# Patient Record
Sex: Male | Born: 1960
Health system: Southern US, Community
[De-identification: ages and names within clinical notes are randomized; demographics above are authoritative.]

## PROBLEM LIST (undated history)

## (undated) DIAGNOSIS — C801 Malignant (primary) neoplasm, unspecified: Secondary | ICD-10-CM

## (undated) DIAGNOSIS — E7800 Pure hypercholesterolemia, unspecified: Secondary | ICD-10-CM

## (undated) DIAGNOSIS — E119 Type 2 diabetes mellitus without complications: Secondary | ICD-10-CM

## (undated) DIAGNOSIS — E785 Hyperlipidemia, unspecified: Secondary | ICD-10-CM

## (undated) HISTORY — PX: WISDOM TOOTH EXTRACTION: SHX21

## (undated) HISTORY — DX: Malignant (primary) neoplasm, unspecified: C80.1

## (undated) HISTORY — DX: Type 2 diabetes mellitus without complications: E11.9

## (undated) HISTORY — DX: Hyperlipidemia, unspecified: E78.5

---

## 1989-08-23 HISTORY — PX: INGUINAL HERNIA REPAIR: SUR1180

## 2008-12-18 ENCOUNTER — Ambulatory Visit: Payer: Self-pay | Admitting: Family Medicine

## 2008-12-18 DIAGNOSIS — R631 Polydipsia: Secondary | ICD-10-CM | POA: Insufficient documentation

## 2008-12-18 DIAGNOSIS — L570 Actinic keratosis: Secondary | ICD-10-CM | POA: Insufficient documentation

## 2008-12-18 DIAGNOSIS — R030 Elevated blood-pressure reading, without diagnosis of hypertension: Secondary | ICD-10-CM | POA: Insufficient documentation

## 2008-12-18 DIAGNOSIS — E1165 Type 2 diabetes mellitus with hyperglycemia: Secondary | ICD-10-CM

## 2008-12-18 DIAGNOSIS — IMO0001 Reserved for inherently not codable concepts without codable children: Secondary | ICD-10-CM | POA: Insufficient documentation

## 2008-12-19 LAB — CONVERTED CEMR LAB
BUN: 16 mg/dL (ref 6–23)
CO2: 29 meq/L (ref 19–32)
Calcium: 9.2 mg/dL (ref 8.4–10.5)
Chloride: 103 meq/L (ref 96–112)
Cholesterol: 221 mg/dL — ABNORMAL HIGH (ref 0–200)
Creatinine, Ser: 0.7 mg/dL (ref 0.4–1.5)
Direct LDL: 117 mg/dL
GFR calc non Af Amer: 127.07 mL/min (ref >60)
Glucose, Bld: 253 mg/dL — ABNORMAL HIGH (ref 70–99)
HDL: 36.2 mg/dL — ABNORMAL LOW (ref >39.00)
Hgb A1c MFr Bld: 10.1 % — ABNORMAL HIGH (ref 4.6–6.5)
Potassium: 4.5 meq/L (ref 3.5–5.1)
Sodium: 139 meq/L (ref 135–145)
Total CHOL/HDL Ratio: 6
Triglycerides: 361 mg/dL — ABNORMAL HIGH (ref 0.0–149.0)
VLDL: 72.2 mg/dL — ABNORMAL HIGH (ref 0.0–40.0)

## 2009-01-16 ENCOUNTER — Ambulatory Visit: Payer: Self-pay | Admitting: Family Medicine

## 2009-01-16 DIAGNOSIS — E119 Type 2 diabetes mellitus without complications: Secondary | ICD-10-CM | POA: Insufficient documentation

## 2009-01-17 LAB — CONVERTED CEMR LAB
Creatinine,U: 95.9 mg/dL
Microalb Creat Ratio: 3.1 mg/g (ref 0.0–30.0)
Microalb, Ur: 0.3 mg/dL (ref 0.0–1.9)

## 2009-04-18 ENCOUNTER — Ambulatory Visit: Payer: Self-pay | Admitting: Family Medicine

## 2009-04-18 DIAGNOSIS — E785 Hyperlipidemia, unspecified: Secondary | ICD-10-CM | POA: Insufficient documentation

## 2009-04-21 LAB — CONVERTED CEMR LAB
Cholesterol: 180 mg/dL (ref 0–200)
HDL: 35.4 mg/dL — ABNORMAL LOW (ref >39.00)
Hgb A1c MFr Bld: 7 % — ABNORMAL HIGH (ref 4.6–6.5)
LDL Cholesterol: 110 mg/dL — ABNORMAL HIGH (ref 0–99)
Total CHOL/HDL Ratio: 5
Triglycerides: 173 mg/dL — ABNORMAL HIGH (ref 0.0–149.0)
VLDL: 34.6 mg/dL (ref 0.0–40.0)

## 2009-08-27 ENCOUNTER — Ambulatory Visit: Payer: Self-pay | Admitting: Family Medicine

## 2009-12-26 ENCOUNTER — Ambulatory Visit: Payer: Self-pay | Admitting: Family Medicine

## 2009-12-26 LAB — CONVERTED CEMR LAB
Blood Glucose, Fingerstick: 164
Cholesterol, target level: 200 mg/dL
Creatinine,U: 144.2 mg/dL
HDL goal, serum: 40 mg/dL
Hgb A1c MFr Bld: 7.4 % — ABNORMAL HIGH (ref 4.6–6.5)
LDL Goal: 100 mg/dL
Microalb Creat Ratio: 0.4 mg/g (ref 0.0–30.0)
Microalb, Ur: 0.6 mg/dL (ref 0.0–1.9)

## 2009-12-26 LAB — HM DIABETES FOOT EXAM

## 2009-12-31 ENCOUNTER — Telehealth: Payer: Self-pay | Admitting: Family Medicine

## 2010-09-22 NOTE — Progress Notes (Signed)
Summary: testing strips wrong, gave new Accu-check monitor  Phone Note Call from Patient Call back at Work Phone [phone]   Summary of Call: Strips were wrong that you ordered last for him. He gave you specific info, he says.  He didn't realize the strips were wrong & he opened the package.  [facility] will not take them back.  What can you do for him?  Needs different strips.   Wants Harriett Sine to call him.  Meanwhile he will get specific name of strips & meter.     Initial call taken by: Rudy Jew, RN,  Dec 31, 2009 1:59 PM  Follow-up for Phone Call        Pt was offered a new Accu-check Aviva monitor, he will pick-up at office today Follow-up by: Sid Falcon LPN,  Jan 01, 2010 11:39 AM

## 2010-09-22 NOTE — Assessment & Plan Note (Signed)
Summary: 4 MNTH ROV//SLM   Vital Signs:  Patient profile:   50 year old male Weight:      231 pounds Temp:     98.5 degrees F oral BP sitting:   130 / 82  (left arm) Cuff size:   large  Vitals Entered By: Sid Falcon LPN (August 27, 2009 8:26 AM) CC: 4 month follow-up   History of Present Illness: F/U diabetes and elevated blood presure. CBGs tend to run higher early AM and well controlled later day. Low 100s late day and freq around 200 early AM. Physical job with lots of walking. Last A1c 7.0%. Taking simvastatin regularly. No side effects. No regular exercise. Gained a few pounds over the holiday and recent poor dietary compliance. No neuropathy symptoms.  Allergies (verified): No Known Drug Allergies  Past History:  Past Medical History: Diabetes mellitus, type II Hyperlipidemia  Review of Systems      See HPI  Physical Exam  General:  Well-developed,well-nourished,in no acute distress; alert,appropriate and cooperative throughout examination Mouth:  Oral mucosa and oropharynx without lesions or exudates.  Teeth in good repair. Neck:  No deformities, masses, or tenderness noted. Lungs:  Normal respiratory effort, chest expands symmetrically. Lungs are clear to auscultation, no crackles or wheezes. Heart:  Normal rate and regular rhythm. S1 and S2 normal without gallop, murmur, click, rub or other extra sounds. Extremities:  No clubbing, cyanosis, edema, or deformity noted with normal full range of motion of all joints.     Impression & Recommendations:  Problem # 1:  DIABETES MELLITUS, TYPE II (ICD-250.00) Take 2 metformin at night and one in AM and work on weight loss.  Repeat A1C at f/u.  Nutritionsist referral for diabetic teaching. His updated medication list for this problem includes:    Metformin Hcl 500 Mg Tabs (Metformin hcl) .Marland Kitchen..Marland Kitchen Two by mouth q am and 1 by mouth q pm.  Orders: Nutrition Referral (Nutrition)  Problem # 2:  ELEVATED BLOOD PRESSURE  (ICD-796.2) Cont weight loss efforts.  Complete Medication List: 1)  Metformin Hcl 500 Mg Tabs (Metformin hcl) .... Two by mouth q am and 1 by mouth q pm. 2)  Simvastatin 20 Mg Tabs (Simvastatin) .Marland Kitchen.. 1 by mouth once daily.  Patient Instructions: 1)  Consider taking 2 metformin at night and one in the morning 2)  Please schedule a follow-up appointment in 4 months .  3)  It is important that you exercise reguarly at least 20 minutes 5 times a week. If you develop chest pain, have severe difficulty breathing, or feel very tired, stop exercising immediately and seek medical attention.  4)  You need to lose weight. Consider a lower calorie diet and regular exercise.

## 2010-09-22 NOTE — Assessment & Plan Note (Signed)
Summary: to be est/pt will come in fasting/njr [facility] WITH PATIENT/MHF   Vital Signs:  Patient profile:   50 year old male Height:      72 inches Weight:      237 pounds BMI:     32.26 Temp:     98 degrees F oral BP sitting:   164 / 110  (left arm) Cuff size:   regular  Vitals Entered By: Sid Falcon LPN (December 18, 2008 9:07 AM)  Serial Vital Signs/Assessments:  Time      Position  BP       Pulse  Resp  Temp     By                     148/88                         Evelena Peat MD  CC: New pt to establish, pt fasting, CBG 246 Is Patient Diabetic? No Pain Assessment Patient in pain? no        History of Present Illness: Patient is a 50 year old gentleman seen as a new patient to establish care. He is seen with almost one year history of some urine frequency, increased thirst and weight loss of approximately 18 pounds. Had good appetite. No significant visual difficulties. He has very strong family history of type 2 diabetes in both parents. He suspected elevated blood sugars and checked blood sugar with his mother's home monitor several months ago and this was around 200. He has not checked any since then. He has tried to make some dietary changes. He is reduced soda intake. He has no other chronic medical problems. Takes no medications. Nonsmoker.  Patient also states she's noticed slight scaliness on the dorsal aspect of both ears. No ulceration or bleeding. Family history significant for brother with melanoma x2. Patient generally tries to use sun protection.  Preventive Screening-Counseling & Management     Smoking Status: never  Allergies (verified): No Known Drug Allergies  Past History:  Past Medical History:    no chronic problems  Family History:    melanoma   brother    Diabetes, parent and grandparent    Family History Hypertension    Lung Cancer, father    Ovary/Uterus cancer  Social History:    Occupation:  Games developer    Married    Never  Smoked    Alcohol use-no    Occupation:  employed    Smoking Status:  never  Review of Systems       The patient complains of weight loss.  The patient denies anorexia, fever, vision loss, chest pain, dyspnea on exertion, peripheral edema, and headaches.    Physical Exam  General:  Well-developed,well-nourished,in no acute distress; alert,appropriate and cooperative throughout examination Eyes:  No corneal or conjunctival inflammation noted. EOMI. Perrla. Funduscopic exam benign, without hemorrhages, exudates or papilledema. Vision grossly normal. Ears:  patient has slight scaliness on the dorsal aspect of the right and left ears. No ulceration. Mouth:  Oral mucosa and oropharynx without lesions or exudates.  Teeth in good repair. Neck:  No deformities, masses, or tenderness noted. Lungs:  Normal respiratory effort, chest expands symmetrically. Lungs are clear to auscultation, no crackles or wheezes. Heart:  Normal rate and regular rhythm. S1 and S2 normal without gallop, murmur, click, rub or other extra sounds. Extremities:  no edema noted Skin:  no concerning pigmented lesions.   Impression &  Recommendations:  Problem # 1:  DIAB W/O MENTION COMP TYPE II/UNS TYPE UNCNTRL (ICD-250.02) This is a new diagnosis. Patient has classic findings of urine frequency, thirst, and mild weight loss. Fasting blood sugar today 246. Extensive educational materials given. Start metformin 500 mg b.i.d. At length about importance of starting regular exercise and reduction of high glycemic foods. Reassess in one month. Patient has home monitor and is encouraged to check fasting sugars at least 3 times weekly. Orders: TLB-Lipid Panel (80061-LIPID) TLB-BMP (Basic Metabolic Panel-BMET) (80048-METABOL) TLB-A1C / Hgb A1C (Glycohemoglobin) (83036-A1C)  His updated medication list for this problem includes:    Metformin Hcl 500 Mg Tabs (Metformin hcl) ..... One by mouth two times a day  Problem # 2:  ACTINIC  KERATOSIS (ICD-702.0) Suspect actinic keratoses of both ears. He has significant risk factors with skin type. Consider treatment liquid nitrogen followup visit one month. Continued adequate sun protection was wearing a hat outdoors and sunblock.  Problem # 3:  ELEVATED BLOOD PRESSURE (ICD-796.2) No diagnosis of hypertension. Follow low-sodium diet, exercise, weight loss and reassess at followup in one month. If over 130/80 at that point consider further treatment.  Check urine microalb/cr ration at f/u and if elevated consider ACE.  Complete Medication List: 1)  Metformin Hcl 500 Mg Tabs (Metformin hcl) .... One by mouth two times a day  Other Orders: Glucose, (CBG) ([address])  Patient Instructions: 1)  Please schedule a follow-up appointment in 1 month.  2)  Check your blood sugars regularly. If your readings are usually above:  or below 70 you should contact our office.  3)  See your eye doctor yearly to check for diabetic eye damage. 4)  Check your feet each night  for sore areas, calluses or signs of infection.  Prescriptions: METFORMIN HCL 500 MG TABS (METFORMIN HCL) one by mouth two times a day  #60 x 11   Entered and Authorized by:   Evelena Peat MD   Signed by:   Evelena Peat MD on 12/18/2008   Method used:   Electronically to        [facility]  [address] (retail)       [address]       [place], [place]  [address]       Ph: [phone]       Fax: [phone]   RxID:   [id number]   Preventive Care Screening  Last Tetanus Booster:    Date:  08/23/1994    Results:  Historical

## 2010-09-22 NOTE — Assessment & Plan Note (Signed)
Summary: 3 month roa//pt will be fasting//LH   Vital Signs:  Patient profile:   50 year old male Weight:      227 pounds Temp:     97.9 degrees F BP sitting:   150 / 100  (left arm) Cuff size:   regular  Vitals Entered By: Sid Falcon LPN (April 18, 2009 8:53 AM)  Serial Vital Signs/Assessments:  Time      Position  BP       Pulse  Resp  Temp     By                     138/78                         Evelena Peat MD  CC: 3 month F/U on diabetes, BS "all over the place"   History of Present Illness: Patient here for followup type 2 diabetes and dyslipidemia. Blood sugars have improved with several fastings are low 100 range. He is on metformin 500 mg 2 in the morning and one at night. Recent urine microalbumin screen negative. A1c at diagnosis 10.1%. Denies any thirst or urine frequency. He has also  lost some weight due to his efforts. Still has some poor dietary compliance at times. Elevated triglycerides and diagnosis with LDL of 117. He is due for repeat lipids and A1c at this time.  Allergies (verified): No Known Drug Allergies  Past History:  Past Medical History: Last updated: 01/16/2009 no chronic problems Diabetes mellitus, type II  Review of Systems       patient has some cold symptoms with upper respiratory congestion but no fever.  Physical Exam  General:  Well-developed,well-nourished,in no acute distress; alert,appropriate and cooperative throughout examination Eyes:  No corneal or conjunctival inflammation noted. EOMI. Perrla. Funduscopic exam benign, without hemorrhages, exudates or papilledema. Vision grossly normal. Ears:  External ear exam shows no significant lesions or deformities.  Otoscopic examination reveals clear canals, tympanic membranes are intact bilaterally without bulging, retraction, inflammation or discharge. Hearing is grossly normal bilaterally. Mouth:  Oral mucosa and oropharynx without lesions or exudates.  Teeth in good  repair. Neck:  No deformities, masses, or tenderness noted. Lungs:  Normal respiratory effort, chest expands symmetrically. Lungs are clear to auscultation, no crackles or wheezes. Heart:  Normal rate and regular rhythm. S1 and S2 normal without gallop, murmur, click, rub or other extra sounds. Extremities:  No clubbing, cyanosis, edema, or deformity noted with normal full range of motion of all joints.     Impression & Recommendations:  Problem # 1:  DIABETES MELLITUS, TYPE II (ICD-250.00)  improved by home readings. Reassess A1c today. Monitor for yearly eye exam. His updated medication list for this problem includes:    Metformin Hcl 500 Mg Tabs (Metformin hcl) .Marland Kitchen..Marland Kitchen Two by mouth q am and 1 by mouth q pm.  Orders: TLB-A1C / Hgb A1C (Glycohemoglobin) (83036-A1C)  Problem # 2:  DYSLIPIDEMIA (ICD-272.4)  goal LDL less than 100. Reassess lipids today.  Orders: TLB-Lipid Panel (80061-LIPID)  Problem # 3:  ELEVATED BLOOD PRESSURE (ICD-796.2) goal blood pressure less than 130/80. He would like to consider weight loss continued close monitoring and add ACE inh if not to goal in 3-4 months.  Complete Medication List: 1)  Metformin Hcl 500 Mg Tabs (Metformin hcl) .... Two by mouth q am and 1 by mouth q pm.  Patient Instructions: 1)  Please schedule a follow-up appointment  in 4 months .

## 2010-09-22 NOTE — Assessment & Plan Note (Signed)
Summary: 4 MONTH ROA//LH   Vital Signs:  Patient profile:   50 year old male Weight:      228 pounds BMI:     31.03 Temp:     97.8 degrees F oral BP sitting:   132 / 78  (left arm) Cuff size:   regular  Vitals Entered By: Sid Falcon LPN (Dec 26, 1608 8:28 AM)  Nutrition Counseling: Patient's BMI is greater than 25 and therefore counseled on weight management options. CC: 4 month follow-up, med refills, , Lipid Management CBG Result 164   History of Present Illness: Followup type 2 diabetes and dyslipidemia.  Patient has been out of simvastatin for one month. No prior side effects.  Blood sugars generally around low 100s later in the day around 200 fasting. Test strips out of date. No hypoglycemic symptoms. No symptoms of hyperglycemia. No eye exam yet. Poor compliance with diet and exercise. Remains on metformin.  Diabetes Management History:      He has not been enrolled in the "Diabetic Education Program".  He states understanding of dietary principles but he is not following the appropriate diet.  No sensory loss is reported.  Self foot exams are being performed.  He is checking home blood sugars.  He says that he is not exercising regularly.        Hypoglycemic symptoms are not occurring.  No hyperglycemic symptoms are reported.    Lipid Management History:      Positive NCEP/ATP III risk factors include male age 63 years old or older, diabetes, and HDL cholesterol less than 40.  Negative NCEP/ATP III risk factors include no family history for ischemic heart disease, non-tobacco-user status, non-hypertensive, no ASHD (atherosclerotic heart disease), no prior stroke/TIA, no peripheral vascular disease, and no history of aortic aneurysm.     Preventive Screening-Counseling & Management  Caffeine-Diet-Exercise     Does Patient Exercise: no  Allergies (verified): No Known Drug Allergies  Past History:  Past Medical History: Last updated: 08/27/2009 Diabetes mellitus,  type II Hyperlipidemia  Family History: Last updated: 12/18/2008 melanoma   brother Diabetes, parent and grandparent Family History Hypertension Lung Cancer, father Ovary/Uterus cancer  Social History: Last updated: 12/18/2008 Occupation:  Games developer Married Never Smoked Alcohol use-no  Risk Factors: Smoking Status: never (12/18/2008)  Social History: Does Patient Exercise:  no  Review of Systems  The patient denies chest pain, syncope, dyspnea on exertion, peripheral edema, prolonged cough, headaches, hemoptysis, and abdominal pain.    Physical Exam  General:  Well-developed,well-nourished,in no acute distress; alert,appropriate and cooperative throughout examination Mouth:  Oral mucosa and oropharynx without lesions or exudates.  Teeth in good repair. Neck:  No deformities, masses, or tenderness noted. Lungs:  Normal respiratory effort, chest expands symmetrically. Lungs are clear to auscultation, no crackles or wheezes. Heart:  Normal rate and regular rhythm. S1 and S2 normal without gallop, murmur, click, rub or other extra sounds. Extremities:  No clubbing, cyanosis, edema, or deformity noted with normal full range of motion of all joints.    Diabetes Management Exam:    Foot Exam (with socks and/or shoes not present):       Sensory-Pinprick/Light touch:          Left medial foot (L-4): normal          Left dorsal foot (L-5): normal          Left lateral foot (S-1): normal          Right medial foot (L-4): normal  Right dorsal foot (L-5): normal          Right lateral foot (S-1): normal       Sensory-Monofilament:          Left foot: normal          Right foot: normal       Inspection:          Left foot: normal          Right foot: normal       Nails:          Left foot: normal          Right foot: normal   Impression & Recommendations:  Problem # 1:  DYSLIPIDEMIA (ICD-272.4) get back on Simvastatin. His updated medication list for  this problem includes:    Simvastatin 20 Mg Tabs (Simvastatin) .Marland Kitchen... 1 by mouth once daily.  Problem # 2:  DIABETES MELLITUS, TYPE II (ICD-250.00) reassess A1C.  Diet discussed.  Start some regular exercise.  Needs eye exam. His updated medication list for this problem includes:    Metformin Hcl 500 Mg Tabs (Metformin hcl) .Marland Kitchen..Marland Kitchen Two by mouth every am and pm  Orders: Capillary Blood Glucose/CBG (11914) Venipuncture ([address]) TLB-A1C / Hgb A1C (Glycohemoglobin) (83036-A1C) TLB-Microalbumin/Creat Ratio, Urine (82043-MALB)  Problem # 3:  ELEVATED BLOOD PRESSURE (ICD-796.2) Discussed med option and he prefers to work on weight loss and monitor.  Complete Medication List: 1)  Metformin Hcl 500 Mg Tabs (Metformin hcl) .... Two by mouth every am and pm 2)  Simvastatin 20 Mg Tabs (Simvastatin) .Marland Kitchen.. 1 by mouth once daily. 3)  Accu-chek Aviva Strp (Glucose blood) .... Once daily as directed  Diabetes Management Assessment/Plan:      The following lipid goals have been established for the patient: Total cholesterol goal of 200; LDL cholesterol goal of 100; HDL cholesterol goal of 40; Triglyceride goal of 150.    Lipid Assessment/Plan:      Based on NCEP/ATP III, the patient's risk factor category is "history of diabetes".  The patient's lipid goals are as follows: Total cholesterol goal is 200; LDL cholesterol goal is 100; HDL cholesterol goal is 40; Triglyceride goal is 150.    Patient Instructions: 1)  Check your blood sugars regularly. If your readings are usually above:  or below 70 you should contact our office.  2)  It is important that your diabetic A1c level is checked every 3 months.  3)  See your eye doctor yearly to check for diabetic eye damage. 4)  Check your feet each night  for sore areas, calluses or signs of infection.  5)  Check your  Blood Pressure regularly . If it is above: 140/90  you should make an appointment. 6)  Please schedule a follow-up appointment in 4 months .    Prescriptions: SIMVASTATIN 20 MG TABS (SIMVASTATIN) 1 by mouth once daily.  #30 Each x 11   Entered and Authorized by:   Evelena Peat MD   Signed by:   Evelena Peat MD on 12/26/2009   Method used:   Electronically to        [facility]  [address] (retail)       [address]       [place], [place]  [address]       Ph: [phone]       Fax: [phone]   RxID:   [id number] METFORMIN HCL 500 MG TABS (METFORMIN HCL) two  by mouth q am and 1 by mouth q pm.  #90 x 11   Entered and Authorized by:   Evelena Peat MD   Signed by:   Evelena Peat MD on 12/26/2009   Method used:   Electronically to        [facility]  [address] [address] (retail)       [address]       [place], [place]  [address]       Ph: [phone]       Fax: [phone]   RxID:   [id number] ACCU-CHEK AVIVA  STRP (GLUCOSE BLOOD) once daily as directed  #50 x 6   Entered by:   Sid Falcon LPN   Authorized by:   Evelena Peat MD   Signed by:   Sid Falcon LPN on 84/13/2440   Method used:   Electronically to        [facility]  [address] (retail)       [address]       [place], [place]  [address]       Ph: [phone]       Fax: [phone]   RxID:   [id number]

## 2010-09-22 NOTE — Assessment & Plan Note (Signed)
Summary: 1 MNTH ROV//SLM   Vital Signs:  Patient profile:   50 year old male Height:      72 inches Weight:      234 pounds BMI:     31.85 Temp:     97.8 degrees F oral BP sitting:   130 / 90  (left arm) Cuff size:   regular  Vitals Entered By: Sid Falcon LPN (Jan 16, 2009 8:26 AM)  Nutrition Counseling: Patient's BMI is greater than 25 and therefore counseled on weight management options. CC: one month follow-up   History of Present Illness: Patient here for followup of recently diagnosed type 2 diabetes as well as elevated blood pressure. He has a home glucose monitor but has not yet acquired test strips. He has made lifestyle changes and has lost some weight by his home scales. He is having significant improvement in symptoms such as urine frequency and thirst. He is taking metformin 500 mg b.i.d. and other than occasional loose stools no side effects. He had recent mild hyperlipidemia but we explained that this may have been exacerbated by his poor diabetes control.  Allergies: No Known Drug Allergies  Past History:     Past Medical History: no chronic problems Diabetes mellitus, type II  Family History: Reviewed history from 12/18/2008 and no changes required. melanoma   brother Diabetes, parent and grandparent Family History Hypertension Lung Cancer, father Ovary/Uterus cancer  Social History: Reviewed history from 12/18/2008 and no changes required. 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  The patient denies anorexia, chest pain, syncope, dyspnea on exertion, peripheral edema, and headaches.    Physical Exam  General:  Well-developed,well-nourished,in no acute distress; alert,appropriate and cooperative throughout examination Neck:  No deformities, masses, or tenderness noted. Lungs:  Normal respiratory effort, chest expands symmetrically. Lungs are clear to  auscultation, no crackles or wheezes. Heart:  Normal rate and regular rhythm. S1 and S2 normal without gallop, murmur, click, rub or other extra sounds. Abdomen:  small umbilical hernia which is soft and nontender no other masses noted Extremities:  No clubbing, cyanosis, edema, or deformity noted with normal full range of motion of all joints.     Impression & Recommendations:  Problem # 1:  DIABETES MELLITUS, TYPE II (ICD-250.00)  check urine microalbumin creatinine ratio. Patient encouraged to get glucose test strips and monitor more frequent. Continue weight loss efforts. Continue metformin and titrate. Reassess A1c in approximately 3 months. Needs baseline eye exam. His updated medication list for this problem includes:    Metformin Hcl 500 Mg Tabs (Metformin hcl) .Marland Kitchen..Marland Kitchen Two by mouth q am and 1 by mouth q pm.  Orders: TLB-Microalbumin/Creat Ratio, Urine (82043-MALB)  Problem # 2:  ELEVATED BLOOD PRESSURE (ICD-796.2) Improved. Continue to work on weight loss efforts. Blood pressure goal is 130/80.  Complete Medication List: 1)  Metformin Hcl 500 Mg Tabs (Metformin hcl) .... Two by mouth q am and 1 by mouth q pm.  Patient Instructions: 1)  Please schedule a follow-up appointment in 3 months .  2)  Increase metformin to 2 tablets in the morning and one at night Prescriptions: METFORMIN HCL 500 MG TABS (METFORMIN HCL) two by mouth q am and 1 by mouth q pm.  #90 x 11   Entered and Authorized by:   Evelena Peat MD   Signed by:   Evelena Peat MD on 01/16/2009   Method  used:   Electronically to        [facility] (retail)       [address]       [place], [place]  [address]       Ph: [phone]       Fax: [phone]   RxID:   [id number]

## 2010-11-19 ENCOUNTER — Other Ambulatory Visit: Payer: Self-pay | Admitting: Family Medicine

## 2010-11-30 ENCOUNTER — Encounter: Payer: Self-pay | Admitting: Family Medicine

## 2010-12-01 ENCOUNTER — Encounter: Payer: Self-pay | Admitting: Family Medicine

## 2010-12-01 ENCOUNTER — Ambulatory Visit (INDEPENDENT_AMBULATORY_CARE_PROVIDER_SITE_OTHER): Payer: BLUE CROSS/BLUE SHIELD | Admitting: Family Medicine

## 2010-12-01 VITALS — BP 122/80 | Temp 98.3°F | Ht 73.0 in | Wt 236.0 lb

## 2010-12-01 DIAGNOSIS — E785 Hyperlipidemia, unspecified: Secondary | ICD-10-CM

## 2010-12-01 DIAGNOSIS — IMO0001 Reserved for inherently not codable concepts without codable children: Secondary | ICD-10-CM

## 2010-12-01 LAB — MICROALBUMIN / CREATININE URINE RATIO
Creatinine,U: 107.7 mg/dL
Microalb Creat Ratio: 0.5 mg/g (ref 0.0–30.0)
Microalb, Ur: 0.5 mg/dL (ref 0.0–1.9)

## 2010-12-01 LAB — LIPID PANEL
Cholesterol: 140 mg/dL (ref 0–200)
HDL: 37 mg/dL — ABNORMAL LOW (ref >39.00)
Total CHOL/HDL Ratio: 4
Triglycerides: 384 mg/dL — ABNORMAL HIGH (ref 0.0–149.0)
VLDL: 76.8 mg/dL — ABNORMAL HIGH (ref 0.0–40.0)

## 2010-12-01 LAB — HEPATIC FUNCTION PANEL
ALT: 38 U/L (ref 0–53)
AST: 29 U/L (ref 0–37)
Albumin: 4.2 g/dL (ref 3.5–5.2)
Alkaline Phosphatase: 48 U/L (ref 39–117)
Bilirubin, Direct: 0.1 mg/dL (ref 0.0–0.3)
Total Bilirubin: 0.6 mg/dL (ref 0.3–1.2)
Total Protein: 7.2 g/dL (ref 6.0–8.3)

## 2010-12-01 LAB — HEMOGLOBIN A1C: Hgb A1c MFr Bld: 8.4 % — ABNORMAL HIGH (ref 4.6–6.5)

## 2010-12-01 MED ORDER — LANCETS FINE 28G MISC: Status: DC

## 2010-12-01 MED ORDER — METFORMIN HCL 500 MG PO TABS: 500.0000 mg | ORAL_TABLET | Freq: Every day | ORAL | Status: DC

## 2010-12-01 MED ORDER — GLUCOSE BLOOD VI STRP: ORAL_STRIP | Status: DC

## 2010-12-01 MED ORDER — SIMVASTATIN 20 MG PO TABS: 20.0000 mg | ORAL_TABLET | Freq: Every day | ORAL | Status: DC

## 2010-12-01 NOTE — Progress Notes (Signed)
  Subjective:    Patient ID: Dylan Hamilton, male    DOB: 08-23-1961, 50 y.o.   MRN: [id number]  HPI Patient seen for medical followup. Type 2 diabetes and hyperlipidemia. Takes metformin 500 mg 2 in the morning and one at night. Simvastatin 20 mg daily. Blood pressures have been borderline elevated in the past. Adequate control by home readings.  Not monitoring blood sugars recently. No symptoms of hyper or hypoglycemia. No visual symptoms. Denies any chest pains or dyspnea.   Review of Systems  Constitutional: Negative for activity change, appetite change, fatigue and unexpected weight change.  Respiratory: Negative for cough and shortness of breath.   Cardiovascular: Negative for chest pain and palpitations.  Gastrointestinal: Negative for abdominal pain.  Neurological: Negative for dizziness and syncope.       Objective:   Physical Exam  Constitutional: He is oriented to person, place, and time. He appears well-developed and well-nourished. No distress.  HENT:  Right Ear: External ear normal.  Left Ear: External ear normal.  Mouth/Throat: Oropharynx is clear and moist. No oropharyngeal exudate.  Eyes: Pupils are equal, round, and reactive to light.  Neck: Neck supple. No thyromegaly present.  Cardiovascular: Normal rate, regular rhythm and normal heart sounds.   No murmur heard. Pulmonary/Chest: Effort normal and breath sounds normal. No respiratory distress. He has no wheezes. He has no rales.  Musculoskeletal: He exhibits no edema.  Lymphadenopathy:    He has no cervical adenopathy.  Neurological: He is alert and oriented to person, place, and time. No cranial nerve deficit.  Psychiatric: He has a normal mood and affect.          Assessment & Plan:  #1 type 2 diabetes. Reassess A1c. Discussed lifestyle management. Continue yearly eye exam. Check urine microalbumin screen. #2 dyslipidemia. Recheck lipids and hepatic panel

## 2010-12-02 ENCOUNTER — Other Ambulatory Visit: Payer: Self-pay | Admitting: Family Medicine

## 2010-12-02 LAB — LDL CHOLESTEROL, DIRECT: Direct LDL: 53.7 mg/dL

## 2010-12-02 MED ORDER — GLIMEPIRIDE 2 MG PO TABS: 2.0000 mg | ORAL_TABLET | ORAL | Status: DC

## 2010-12-02 NOTE — Progress Notes (Signed)
Quick Note:  Pt informed on personally identified cell VM ______

## 2011-04-02 ENCOUNTER — Ambulatory Visit: Payer: BLUE CROSS/BLUE SHIELD | Admitting: Family Medicine

## 2011-04-02 DIAGNOSIS — Z0289 Encounter for other administrative examinations: Secondary | ICD-10-CM

## 2011-06-16 LAB — HM DIABETES EYE EXAM

## 2011-06-17 ENCOUNTER — Encounter: Payer: Self-pay | Admitting: Family Medicine

## 2011-07-12 ENCOUNTER — Other Ambulatory Visit: Payer: Self-pay | Admitting: Family Medicine

## 2011-08-04 ENCOUNTER — Encounter: Payer: Self-pay | Admitting: Family Medicine

## 2011-08-04 ENCOUNTER — Ambulatory Visit (INDEPENDENT_AMBULATORY_CARE_PROVIDER_SITE_OTHER): Payer: BLUE CROSS/BLUE SHIELD | Admitting: Family Medicine

## 2011-08-04 DIAGNOSIS — E119 Type 2 diabetes mellitus without complications: Secondary | ICD-10-CM

## 2011-08-04 DIAGNOSIS — E785 Hyperlipidemia, unspecified: Secondary | ICD-10-CM

## 2011-08-04 DIAGNOSIS — H60509 Unspecified acute noninfective otitis externa, unspecified ear: Secondary | ICD-10-CM

## 2011-08-04 DIAGNOSIS — H60549 Acute eczematoid otitis externa, unspecified ear: Secondary | ICD-10-CM

## 2011-08-04 LAB — HEPATIC FUNCTION PANEL
ALT: 38 U/L (ref 0–53)
AST: 33 U/L (ref 0–37)
Albumin: 4.3 g/dL (ref 3.5–5.2)
Alkaline Phosphatase: 47 U/L (ref 39–117)
Bilirubin, Direct: 0.1 mg/dL (ref 0.0–0.3)
Total Bilirubin: 0.6 mg/dL (ref 0.3–1.2)
Total Protein: 7.6 g/dL (ref 6.0–8.3)

## 2011-08-04 LAB — LIPID PANEL
Cholesterol: 150 mg/dL (ref 0–200)
HDL: 39.6 mg/dL (ref >39.00)
Total CHOL/HDL Ratio: 4
Triglycerides: 416 mg/dL — ABNORMAL HIGH (ref 0.0–149.0)
VLDL: 83.2 mg/dL — ABNORMAL HIGH (ref 0.0–40.0)

## 2011-08-04 LAB — HEMOGLOBIN A1C: Hgb A1c MFr Bld: 7.9 % — ABNORMAL HIGH (ref 4.6–6.5)

## 2011-08-04 LAB — GLUCOSE, POCT (MANUAL RESULT ENTRY): POC Glucose: 190

## 2011-08-04 LAB — LDL CHOLESTEROL, DIRECT: Direct LDL: 56.5 mg/dL

## 2011-08-04 MED ORDER — MOMETASONE FUROATE 0.1 % EX SOLN: Freq: Every day | CUTANEOUS | Status: DC

## 2011-08-04 MED ORDER — GLIMEPIRIDE 2 MG PO TABS: 2.0000 mg | ORAL_TABLET | Freq: Every day | ORAL | Status: DC

## 2011-08-04 MED ORDER — SIMVASTATIN 20 MG PO TABS: 20.0000 mg | ORAL_TABLET | Freq: Every day | ORAL | Status: DC

## 2011-08-04 MED ORDER — METFORMIN HCL 500 MG PO TABS: 500.0000 mg | ORAL_TABLET | Freq: Every day | ORAL | Status: DC

## 2011-08-04 NOTE — Progress Notes (Signed)
  Subjective:    Patient ID: Dylan Hamilton, male    DOB: December 08, 1960, 50 y.o.   MRN: [id number]  HPI  Medical followup. Type 2 diabetes. Poor compliance. No regular exercise. Not monitoring blood sugars. Last A1c 8.4%. No symptoms of hyperglycemia. Hyperlipidemia with elevated triglycerides. Remains on simvastatin 20 mg daily. We recently added Amaryl 2 milligrams daily. No hypoglycemia.  Patient also complains of pruritic scaly rash ear canals intermittently. Tried hydrocortisone cream without much. No drainage.  Past Medical History  Diagnosis Date  . Diabetes mellitus type II   . Hyperlipidemia    No past surgical history on file.  reports that he has never smoked. He does not have any smokeless tobacco history on file. He reports that he does not drink alcohol or use illicit drugs. family history includes Cancer in his father and other and Diabetes in his other. No Known Allergies    Review of Systems  Constitutional: Negative for fatigue.  HENT: Negative for sore throat.   Eyes: Negative for visual disturbance.  Respiratory: Negative for cough, chest tightness and shortness of breath.   Cardiovascular: Negative for chest pain, palpitations and leg swelling.  Neurological: Negative for dizziness, syncope, weakness, light-headedness and headaches.       Objective:   Physical Exam  Constitutional: He appears well-developed and well-nourished. No distress.  HENT:  Mouth/Throat: Oropharynx is clear and moist.       Minimal scale both ear canals otherwise normal  Neck: Neck supple.  Cardiovascular: Normal rate and regular rhythm.   Pulmonary/Chest: Effort normal and breath sounds normal. No respiratory distress. He has no wheezes. He has no rales.  Musculoskeletal: He exhibits no edema.  Lymphadenopathy:    He has no cervical adenopathy.          Assessment & Plan:  #1 type 2 diabetes. History of suboptimal control. Recheck A1c. Work on weight loss #2 dyslipidemia.  Recheck lipid and hepatic panel. Consider omega-3 supplement #3 eczema ear canals.  Elocon lotion once daily as needed

## 2011-08-05 ENCOUNTER — Other Ambulatory Visit: Payer: Self-pay | Admitting: Family Medicine

## 2011-08-05 MED ORDER — GLIMEPIRIDE 4 MG PO TABS: 4.0000 mg | ORAL_TABLET | Freq: Every day | ORAL | Status: DC

## 2011-08-05 NOTE — Progress Notes (Signed)
Quick Note:  Pt informed on VM, copy sent to pt home, Amaryl 4 mg sent to pharmacy ______

## 2011-11-10 ENCOUNTER — Other Ambulatory Visit: Payer: Self-pay | Admitting: Family Medicine

## 2012-07-17 ENCOUNTER — Encounter (HOSPITAL_COMMUNITY)
Admission: EM | Disposition: A | Discharge status: Home / Self Care | Payer: Self-pay | Source: Home / Self Care | Attending: Emergency Medicine

## 2012-07-17 ENCOUNTER — Emergency Department (HOSPITAL_COMMUNITY)
Admission: EM | Admit: 2012-07-17 | Discharge: 2012-07-17 | Disposition: A | Discharge status: Home / Self Care | Payer: Worker's Compensation | Attending: Emergency Medicine | Admitting: Emergency Medicine

## 2012-07-17 ENCOUNTER — Emergency Department (HOSPITAL_COMMUNITY): Payer: Worker's Compensation | Admitting: Anesthesiology

## 2012-07-17 ENCOUNTER — Inpatient Hospital Stay: Admit: 2012-07-17 | Payer: Self-pay | Admitting: General Surgery

## 2012-07-17 ENCOUNTER — Encounter (HOSPITAL_COMMUNITY): Payer: Self-pay

## 2012-07-17 ENCOUNTER — Encounter (HOSPITAL_COMMUNITY): Payer: Self-pay | Admitting: Anesthesiology

## 2012-07-17 ENCOUNTER — Emergency Department (HOSPITAL_COMMUNITY): Payer: Worker's Compensation

## 2012-07-17 DIAGNOSIS — E785 Hyperlipidemia, unspecified: Secondary | ICD-10-CM | POA: Insufficient documentation

## 2012-07-17 DIAGNOSIS — E7800 Pure hypercholesterolemia, unspecified: Secondary | ICD-10-CM | POA: Insufficient documentation

## 2012-07-17 DIAGNOSIS — S61011A Laceration without foreign body of right thumb without damage to nail, initial encounter: Secondary | ICD-10-CM

## 2012-07-17 DIAGNOSIS — W3189XA Contact with other specified machinery, initial encounter: Secondary | ICD-10-CM | POA: Insufficient documentation

## 2012-07-17 DIAGNOSIS — E119 Type 2 diabetes mellitus without complications: Secondary | ICD-10-CM | POA: Insufficient documentation

## 2012-07-17 DIAGNOSIS — S61209A Unspecified open wound of unspecified finger without damage to nail, initial encounter: Secondary | ICD-10-CM | POA: Insufficient documentation

## 2012-07-17 DIAGNOSIS — Y9289 Other specified places as the place of occurrence of the external cause: Secondary | ICD-10-CM | POA: Insufficient documentation

## 2012-07-17 DIAGNOSIS — Y990 Civilian activity done for income or pay: Secondary | ICD-10-CM | POA: Insufficient documentation

## 2012-07-17 DIAGNOSIS — S6710XA Crushing injury of unspecified finger(s), initial encounter: Secondary | ICD-10-CM | POA: Insufficient documentation

## 2012-07-17 HISTORY — DX: Pure hypercholesterolemia, unspecified: E78.00

## 2012-07-17 HISTORY — PX: I & D EXTREMITY: SHX5045

## 2012-07-17 LAB — GLUCOSE, CAPILLARY
Glucose-Capillary: 117 mg/dL — ABNORMAL HIGH (ref 70–99)
Glucose-Capillary: 108 mg/dL — ABNORMAL HIGH (ref 70–99)

## 2012-07-17 SURGERY — IRRIGATION AND DEBRIDEMENT EXTREMITY
Anesthesia: General | Site: Thumb | Laterality: Right | Wound class: Contaminated

## 2012-07-17 MED ORDER — PROPOFOL 10 MG/ML IV BOLUS
INTRAVENOUS | Status: DC | PRN
  Administered 2012-07-17: 200 mg via INTRAVENOUS

## 2012-07-17 MED ORDER — MIDAZOLAM HCL 5 MG/5ML IJ SOLN
INTRAMUSCULAR | Status: DC | PRN
  Administered 2012-07-17: 2 mg via INTRAVENOUS

## 2012-07-17 MED ORDER — MIDAZOLAM HCL 2 MG/2ML IJ SOLN: 0.5000 mg | Freq: Once | INTRAMUSCULAR | Status: DC | PRN

## 2012-07-17 MED ORDER — ONDANSETRON HCL 4 MG/2ML IJ SOLN
INTRAMUSCULAR | Status: DC | PRN
  Administered 2012-07-17: 4 mg via INTRAVENOUS

## 2012-07-17 MED ORDER — OXYCODONE HCL 5 MG/5ML PO SOLN: 5.0000 mg | Freq: Once | ORAL | Status: DC | PRN

## 2012-07-17 MED ORDER — LACTATED RINGERS IV SOLN
INTRAVENOUS | Status: DC | PRN
  Administered 2012-07-17 (×2): via INTRAVENOUS

## 2012-07-17 MED ORDER — SODIUM CHLORIDE 0.9 % IV SOLN
INTRAVENOUS | Status: DC
  Administered 2012-07-17: 16:00:00 via INTRAVENOUS

## 2012-07-17 MED ORDER — PROMETHAZINE HCL 25 MG/ML IJ SOLN: 6.2500 mg | INTRAMUSCULAR | Status: DC | PRN

## 2012-07-17 MED ORDER — EPHEDRINE SULFATE 50 MG/ML IJ SOLN
INTRAMUSCULAR | Status: DC | PRN
  Administered 2012-07-17 (×2): 5 mg via INTRAVENOUS

## 2012-07-17 MED ORDER — FENTANYL CITRATE 0.05 MG/ML IJ SOLN
INTRAMUSCULAR | Status: DC | PRN
  Administered 2012-07-17: 100 ug via INTRAVENOUS
  Administered 2012-07-17 (×3): 50 ug via INTRAVENOUS

## 2012-07-17 MED ORDER — IPRATROPIUM BROMIDE 0.02 % IN SOLN: 0.5000 mg | Freq: Once | RESPIRATORY_TRACT | Status: DC

## 2012-07-17 MED ORDER — CEFAZOLIN SODIUM 1-5 GM-% IV SOLN
1.0000 g | Freq: Once | INTRAVENOUS | Status: AC
  Administered 2012-07-17 (×2): 1 g via INTRAVENOUS
  Filled 2012-07-17: qty 50

## 2012-07-17 MED ORDER — SUCCINYLCHOLINE CHLORIDE 20 MG/ML IJ SOLN
INTRAMUSCULAR | Status: DC | PRN
  Administered 2012-07-17: 120 mg via INTRAVENOUS

## 2012-07-17 MED ORDER — ALBUTEROL SULFATE (5 MG/ML) 0.5% IN NEBU: 5.0000 mg | INHALATION_SOLUTION | Freq: Once | RESPIRATORY_TRACT | Status: DC

## 2012-07-17 MED ORDER — POLYMYXIN B SULFATE 500000 UNITS IJ SOLR
INTRAMUSCULAR | Status: DC | PRN
  Administered 2012-07-17: 21:00:00

## 2012-07-17 MED ORDER — ROCURONIUM BROMIDE 100 MG/10ML IV SOLN
INTRAVENOUS | Status: DC | PRN
  Administered 2012-07-17: 50 mg via INTRAVENOUS

## 2012-07-17 MED ORDER — HYDROMORPHONE HCL PF 1 MG/ML IJ SOLN: 0.2500 mg | INTRAMUSCULAR | Status: DC | PRN

## 2012-07-17 MED ORDER — PREDNISONE 20 MG PO TABS: 60.0000 mg | ORAL_TABLET | Freq: Once | ORAL | Status: DC

## 2012-07-17 MED ORDER — MEPERIDINE HCL 25 MG/ML IJ SOLN: 6.2500 mg | INTRAMUSCULAR | Status: DC | PRN

## 2012-07-17 MED ORDER — NEOSTIGMINE METHYLSULFATE 1 MG/ML IJ SOLN
INTRAMUSCULAR | Status: DC | PRN
  Administered 2012-07-17: 5 mg via INTRAVENOUS

## 2012-07-17 MED ORDER — GLYCOPYRROLATE 0.2 MG/ML IJ SOLN
INTRAMUSCULAR | Status: DC | PRN
  Administered 2012-07-17: .7 mg via INTRAVENOUS

## 2012-07-17 MED ORDER — BUPIVACAINE HCL (PF) 0.25 % IJ SOLN
INTRAMUSCULAR | Status: DC | PRN
  Administered 2012-07-17: 8 mL

## 2012-07-17 MED ORDER — DEXTROSE 5 % IV SOLN
INTRAVENOUS | Status: DC | PRN
  Administered 2012-07-17: 21:00:00 via INTRAVENOUS

## 2012-07-17 MED ORDER — HYDROMORPHONE HCL PF 1 MG/ML IJ SOLN
1.0000 mg | Freq: Once | INTRAMUSCULAR | Status: AC
  Administered 2012-07-17: 1 mg via INTRAVENOUS
  Filled 2012-07-17: qty 1

## 2012-07-17 MED ORDER — OXYCODONE HCL 5 MG PO TABS: 5.0000 mg | ORAL_TABLET | Freq: Once | ORAL | Status: DC | PRN

## 2012-07-17 MED ORDER — ONDANSETRON HCL 4 MG/2ML IJ SOLN
4.0000 mg | Freq: Once | INTRAMUSCULAR | Status: AC
  Administered 2012-07-17: 4 mg via INTRAVENOUS
  Filled 2012-07-17: qty 2

## 2012-07-17 MED ORDER — SODIUM CHLORIDE 0.9 % IR SOLN
Status: DC | PRN
  Administered 2012-07-17: 1

## 2012-07-17 SURGICAL SUPPLY — 45 items
BAG DECANTER FOR FLEXI CONT (MISCELLANEOUS) ×2 IMPLANT
BANDAGE CONFORM 2  STR LF (GAUZE/BANDAGES/DRESSINGS) ×2 IMPLANT
BANDAGE ELASTIC 3 VELCRO ST LF (GAUZE/BANDAGES/DRESSINGS) IMPLANT
BANDAGE ELASTIC 4 VELCRO ST LF (GAUZE/BANDAGES/DRESSINGS) IMPLANT
BANDAGE GAUZE ELAST BULKY 4 IN (GAUZE/BANDAGES/DRESSINGS) ×2 IMPLANT
BNDG COHESIVE 1X5 TAN STRL LF (GAUZE/BANDAGES/DRESSINGS) ×2 IMPLANT
BNDG ELASTIC 2 VLCR STRL LF (GAUZE/BANDAGES/DRESSINGS) IMPLANT
CLOTH BEACON ORANGE TIMEOUT ST (SAFETY) ×2 IMPLANT
CORDS BIPOLAR (ELECTRODE) ×2 IMPLANT
CUFF TOURNIQUET SINGLE 18IN (TOURNIQUET CUFF) ×2 IMPLANT
DRAPE SURG 17X23 STRL (DRAPES) ×2 IMPLANT
ELECT REM PT RETURN 9FT ADLT (ELECTROSURGICAL)
ELECTRODE REM PT RTRN 9FT ADLT (ELECTROSURGICAL) IMPLANT
GAUZE PACKING IODOFORM 1/4X5 (PACKING) IMPLANT
GAUZE XEROFORM 1X8 LF (GAUZE/BANDAGES/DRESSINGS) ×2 IMPLANT
GLOVE ORTHO TXT STRL SZ7.5 (GLOVE) ×2 IMPLANT
GOWN STRL NON-REIN LRG LVL3 (GOWN DISPOSABLE) ×4 IMPLANT
HANDPIECE INTERPULSE COAX TIP (DISPOSABLE)
KIT BASIN OR (CUSTOM PROCEDURE TRAY) ×2 IMPLANT
KIT ROOM TURNOVER OR (KITS) ×2 IMPLANT
MANIFOLD NEPTUNE II (INSTRUMENTS) ×2 IMPLANT
NEEDLE HYPO 25GX1X1/2 BEV (NEEDLE) ×2 IMPLANT
NS IRRIG 1000ML POUR BTL (IV SOLUTION) ×2 IMPLANT
PACK ORTHO EXTREMITY (CUSTOM PROCEDURE TRAY) ×2 IMPLANT
PAD ARMBOARD 7.5X6 YLW CONV (MISCELLANEOUS) ×4 IMPLANT
PAD CAST 4YDX4 CTTN HI CHSV (CAST SUPPLIES) IMPLANT
PADDING CAST COTTON 4X4 STRL (CAST SUPPLIES)
SET HNDPC FAN SPRY TIP SCT (DISPOSABLE) IMPLANT
SOAP 2 % CHG 4 OZ (WOUND CARE) ×2 IMPLANT
SPONGE GAUZE 4X4 12PLY (GAUZE/BANDAGES/DRESSINGS) ×2 IMPLANT
SPONGE LAP 18X18 X RAY DECT (DISPOSABLE) IMPLANT
SPONGE LAP 4X18 X RAY DECT (DISPOSABLE) ×2 IMPLANT
SUT CHROMIC 6 0 PS 4 (SUTURE) ×2 IMPLANT
SUT ETHILON 5 0 P 3 18 (SUTURE) ×2
SUT FIBERWIRE 4-0 18 DIAM BLUE (SUTURE) ×2
SUT NYLON ETHILON 5-0 P-3 1X18 (SUTURE) ×2 IMPLANT
SUT PROLENE 4 0 P 3 18 (SUTURE) ×4 IMPLANT
SUTURE FIBERWR 4-0 18 DIA BLUE (SUTURE) ×1 IMPLANT
SYR CONTROL 10ML LL (SYRINGE) ×2 IMPLANT
TOWEL OR 17X24 6PK STRL BLUE (TOWEL DISPOSABLE) ×2 IMPLANT
TOWEL OR 17X26 10 PK STRL BLUE (TOWEL DISPOSABLE) ×2 IMPLANT
TUBE ANAEROBIC SPECIMEN COL (MISCELLANEOUS) IMPLANT
TUBE CONNECTING 12X1/4 (SUCTIONS) ×2 IMPLANT
WATER STERILE IRR 1000ML POUR (IV SOLUTION) ×2 IMPLANT
YANKAUER SUCT BULB TIP NO VENT (SUCTIONS) ×2 IMPLANT

## 2012-07-17 NOTE — Transfer of Care (Signed)
Immediate Anesthesia Transfer of Care Note  Patient: Dylan Hamilton  Procedure(s) Performed: Procedure(s) (LRB) with comments: IRRIGATION AND DEBRIDEMENT EXTREMITY (Right) - Repair Right thumb laceration  Patient Location: PACU  Anesthesia Type:General  Level of Consciousness: awake, alert  and oriented  Airway & Oxygen Therapy: Patient Spontanous Breathing and Patient connected to nasal cannula oxygen  Post-op Assessment: Report given to PACU RN and Post -op Vital signs reviewed and stable  Post vital signs: Reviewed and stable  Complications: No apparent anesthesia complications

## 2012-07-17 NOTE — ED Notes (Addendum)
Pt from work, c/o thumb caught in belt of a washer, degloved the right thumb, bone intact, tetnus up to date

## 2012-07-17 NOTE — Anesthesia Postprocedure Evaluation (Signed)
  Anesthesia Post-op Note  Patient: Dylan Hamilton  Procedure(s) Performed: Procedure(s) (LRB) with comments: IRRIGATION AND DEBRIDEMENT EXTREMITY (Right) - Repair Right thumb laceration  Patient Location: PACU  Anesthesia Type:General  Level of Consciousness: awake, alert , oriented and patient cooperative  Airway and Oxygen Therapy: Patient Spontanous Breathing  Post-op Pain: none  Post-op Assessment: Post-op Vital signs reviewed, Patient's Cardiovascular Status Stable, Respiratory Function Stable, Patent Airway, No signs of Nausea or vomiting and Pain level controlled  Post-op Vital Signs: Reviewed and stable  Complications: No apparent anesthesia complications

## 2012-07-17 NOTE — H&P (Signed)
Reason for Consult:laceration R thumb Referring Physician: ER  Ahijah Over is an 51 y.o. right handed male.  HPI: pt was using a commercial washer at work, got thumb caught / c/o pain 9/10, sharp, bleeding, constant, non radiating, c/o skin loose.  Past Medical History  Diagnosis Date  . Diabetes mellitus type II   . Hyperlipidemia   . Hypercholesterolemia     History reviewed. No pertinent past surgical history.  Family History  Problem Relation Age of Onset  . Cancer Father     lung  . Cancer Other   . Diabetes Other     Social History:  reports that he has never smoked. He does not have any smokeless tobacco history on file. He reports that he does not drink alcohol or use illicit drugs.  Allergies: No Known Allergies  Medications: I have reviewed the patient's current medications.  No results found for this or any previous visit (from the past 48 hour(s)).  Dg Finger Thumb Right  07/17/2012  *RADIOLOGY REPORT*  Clinical Data: Right thumb injury.  RIGHT THUMB 2+V  Comparison: None.  Findings: Three views of the thumb demonstrate soft tissue swelling along the tip.  No evidence for acute fracture or dislocation. There are areas of spurring involving the right thumb.  IMPRESSION: Soft tissue swelling without acute fracture.  Mild degenerative changes.   Original Report Authenticated By: Richarda Overlie, M.D.     Pertinent items are noted in HPI. Temp:  [97.9 F (36.6 C)] 97.9 F (36.6 C) (11/25 1439) Pulse Rate:  [79] 79  (11/25 1439) Resp:  [16] 16  (11/25 1439) BP: (148)/(98) 148/98 mmHg (11/25 1439) SpO2:  [93 %] 93 % (11/25 1439) General appearance: alert and cooperative Resp: clear to auscultation bilaterally Cardio: regular rate and rhythm Extremities: extremities normal, atraumatic, no cyanosis or edema except for R thumb with degloving type injury involving volar tissue, nail bed laceration as well, flap is dirty but appears viable   Assessment/Plan: Complex  degloving type laceration to R thumb Plan: To or for exploration repair  Jissel Slavens CHRISTOPHER 07/17/2012, 5:53 PM

## 2012-07-17 NOTE — Preoperative (Signed)
Beta Blockers   Not taking beta Blockers

## 2012-07-17 NOTE — Anesthesia Procedure Notes (Signed)
Procedure Name: Intubation Date/Time: 07/17/2012 8:50 PM Performed by: Markala Sitts S Pre-anesthesia Checklist: Patient identified, Timeout performed, Emergency Drugs available, Suction available and Patient being monitored Patient Re-evaluated:Patient Re-evaluated prior to inductionOxygen Delivery Method: Circle system utilized Preoxygenation: Pre-oxygenation with 100% oxygen Intubation Type: IV induction Ventilation: Mask ventilation without difficulty Laryngoscope Size: Mac and 4 Grade View: Grade I Tube type: Oral Tube size: 8.0 mm Number of attempts: 2 Airway Equipment and Method: Bougie stylet Placement Confirmation: ETT inserted through vocal cords under direct vision,  positive ETCO2 and breath sounds checked- equal and bilateral Secured at: 23 cm Tube secured with: Tape Dental Injury: Teeth and Oropharynx as per pre-operative assessment

## 2012-07-17 NOTE — Anesthesia Preprocedure Evaluation (Signed)
Anesthesia Evaluation  Patient identified by MRN, date of birth, ID band Patient awake    Reviewed: Allergy & Precautions, H&P , NPO status , Patient's Chart, lab work & pertinent test results  History of Anesthesia Complications Negative for: history of anesthetic complications  Airway Mallampati: II TM Distance: >3 FB Neck ROM: Full    Dental  (+) Partial Lower, Caps and Dental Advisory Given   Pulmonary neg pulmonary ROS,  breath sounds clear to auscultation  Pulmonary exam normal       Cardiovascular negative cardio ROS  Rhythm:Regular Rate:Normal     Neuro/Psych negative neurological ROS     GI/Hepatic Neg liver ROS, GERD-  Poorly Controlled,  Endo/Other  diabetes (glu 117), Well Controlled, Type 2, Oral Hypoglycemic AgentsMorbid obesity  Renal/GU negative Renal ROS     Musculoskeletal   Abdominal (+) + obese,   Peds  Hematology   Anesthesia Other Findings   Reproductive/Obstetrics                           Anesthesia Physical Anesthesia Plan  ASA: II  Anesthesia Plan: General   Post-op Pain Management:    Induction: Intravenous  Airway Management Planned: Oral ETT  Additional Equipment:   Intra-op Plan:   Post-operative Plan: Extubation in OR  Informed Consent: I have reviewed the patients History and Physical, chart, labs and discussed the procedure including the risks, benefits and alternatives for the proposed anesthesia with the patient or authorized representative who has indicated his/her understanding and acceptance.   Dental advisory given  Plan Discussed with: Surgeon and CRNA  Anesthesia Plan Comments: (Plan routine monitors, GETA)        Anesthesia Quick Evaluation

## 2012-07-17 NOTE — ED Provider Notes (Signed)
History     CSN: [id number]  Arrival date & time 07/17/12  1437   First MD Initiated Contact with Patient 07/17/12 1459      Chief Complaint  Patient presents with  . Finger Injury    (Consider location/radiation/quality/duration/timing/severity/associated sxs/prior treatment) The history is provided by the patient.  pt c/o injury to right thumb at work just pta today. Got right thumb caught in belt of machine. Partial degloving injury/crush injury to thumb. Right hand dominant. Constant, dull, moderate-sev, non radiating pain, worse w palpation. Thumb feels mildly numb. Denies other injury.   Past Medical History  Diagnosis Date  . Diabetes mellitus type II   . Hyperlipidemia   . Hypercholesterolemia     History reviewed. No pertinent past surgical history.  Family History  Problem Relation Age of Onset  . Cancer Father     lung  . Cancer Other   . Diabetes Other     History  Substance Use Topics  . Smoking status: Never Smoker   . Smokeless tobacco: Not on file  . Alcohol Use: No      Review of Systems  Constitutional: Negative for fever.  Respiratory: Negative for shortness of breath.   Skin: Positive for wound.  Neurological: Positive for numbness.    Allergies  Review of patient's allergies indicates no known allergies.  Home Medications   Current Outpatient Rx  Name  Route  Sig  Dispense  Refill  . GLIMEPIRIDE 4 MG PO TABS   Oral   Take 4 mg by mouth daily before breakfast.         . METFORMIN HCL 500 MG PO TABS   Oral   Take 1,000 mg by mouth 2 (two) times daily with a meal.         . SIMVASTATIN 20 MG PO TABS   Oral   Take 1 tablet (20 mg total) by mouth at bedtime.   90 tablet   3   . GLUCOSE BLOOD VI STRP      Use as instructed   100 each   3     Use daily as instructed   . LANCETS FINE 28G MISC      Test BS daily as directed   100 each   3     BP 148/98  Pulse 79  Temp 97.9 F (36.6 C)  Resp 16  SpO2  93%  Physical Exam  Nursing note and vitals reviewed. Constitutional: He is oriented to person, place, and time. He appears well-developed and well-nourished. No distress.  HENT:  Head: Atraumatic.  Eyes: Conjunctivae normal are normal.  Neck: Neck supple. No tracheal deviation present.  Cardiovascular: Normal rate and intact distal pulses.   Pulmonary/Chest: Effort normal. No accessory muscle usage. No respiratory distress.  Abdominal: He exhibits no distension.  Musculoskeletal: Normal range of motion.       sts and tenderness to right thumb. Partial degloving injury w multiple lacs to thumb, nail avulsed. Limited rom ?pain/swelling.   Neurological: He is alert and oriented to person, place, and time.  Skin: Skin is warm and dry.  Psychiatric: He has a normal mood and affect.    ED Course  Procedures (including critical care time)  Dg Finger Thumb Right  07/17/2012  *RADIOLOGY REPORT*  Clinical Data: Right thumb injury.  RIGHT THUMB 2+V  Comparison: None.  Findings: Three views of the thumb demonstrate soft tissue swelling along the tip.  No evidence for acute fracture or dislocation. There  are areas of spurring involving the right thumb.  IMPRESSION: Soft tissue swelling without acute fracture.  Mild degenerative changes.   Original Report Authenticated By: Richarda Overlie, M.D.        MDM  Xrays.   Iv ns. Dilaudid 1 mg iv. zofran iv. Ancef iv. Tetanus up to date within past year.  Reviewed nursing notes and prior charts for additional history.   Sterile moist dressing.   Hand surgery paged after initial eval.  Hand repaged post xray.   Dr Izora Ribas states will see in ED, and he will repair thumb here.  Recheck pain relieved post meds and digit block - sterile technique, 2% lido no epi, total 6 cc used.  Recheck intact cap refill distally. Pt able to flex/extend thumb.   Definitive wound care/suturing, and follow up per Dr Izora Ribas.           Suzi Roots,  MD 07/17/12 [phone]

## 2012-07-18 ENCOUNTER — Other Ambulatory Visit: Payer: Self-pay | Admitting: Family Medicine

## 2012-07-18 ENCOUNTER — Encounter (HOSPITAL_COMMUNITY): Payer: Self-pay | Admitting: General Surgery

## 2012-07-18 NOTE — Op Note (Signed)
NAMEEULISES, KIJOWSKI NO.:  [id number]  MEDICAL RECORD NO.:  [id number]  LOCATION:  [facility]                         FACILITY:  [facility]  PHYSICIAN:  Johnette Abraham, MD    DATE OF BIRTH:  10/09/1960  DATE OF PROCEDURE:  07/17/2012 DATE OF DISCHARGE:                              OPERATIVE REPORT   PREOPERATIVE DIAGNOSIS:  Complex laceration and degloving injury to his right thumb.  POSTOPERATIVE DIAGNOSIS:  Complex laceration and degloving injury to his right thumb.  PROCEDURE:  Exploration of the wound, right thumb.  Repair of nail bed, right thumb.  Repair of extensor tendon, right thumb.  Repair of laceration, totaling 8 cm.  ANESTHESIA:  General.  INDICATIONS:  Mr. Paulos is a pleasant, who got his thumb caught in the some kind of washing apparatus, presented emergency room.  I was consulted for definitive care.  On evaluation, he had a complex laceration with active bleeding.  Risks, benefits, and alternatives of surgery were thoroughly discussed with him, and agreed to proceed with surgery.  Consent was obtained.  DESCRIPTION OF PROCEDURE:  The patient was taken to the operating room, placed supine on the operating room table.  General anesthesia was administered.  The right thumb was prepped and draped in a normal sterile fashion.  A tourniquet was used and inflated to 250 mmHg.  The wound was explored.  There was a large degloving type of laceration to the thumb, it was held in place ulnarly.  There was also a transverse laceration over the volar aspect underlying the IP joint, dorsally. There was a laceration over the IP joint, with extensor tendon laceration.  There was skin loss to the distal tip.  There was some skin loss to the ulnar nail bed area.  There was a complex nail bed laceration.  There was nonviable skin edges throughout all of these lacerations.  Next, thorough irrigation with bacitracin antibiotic irrigation fluid for approximately a  liter as well as scraping and rough debriding of all of the skin and subcutaneous tissues.  There was still quite a bit of staining into the tissues even after thorough cleansing was performed.  The small laceration on the dorsal aspect of the thumb was then lengthened to expose both the ends of the extensor tendon.  The extensor tendon was grasped and a modified capsular repair with 4-0 FiberWire, the tendon edges were approximated nicely.  The skin overlying this was closed with interrupted 5-0 nylon sutures.  The nail bed was then addressed.  There was a stellate complex laceration down to the distal phalanx.  The pieces of nail matrix were then approximated with interrupted 6-0 chromic sutures.  There was a scum at skin flap forming most of the eponychial fold, and it was also lacerated this was tacked down with interrupted 5-0 nylon.  There was some skin loss and abrasion to the ulnar aspect of the thumb that was unable to be approximated.  Next, the complex laceration on the volar aspect was addressed.  Again, there was a large gaping laceration down to the flexor tendon sheath.  The neurovascular structures were exposed.  This was held ulnarly and again  there was a horizontal component to this laceration.  The horizontal component to the laceration was closed with interrupted 4-0 Vicryl after skin and subcutaneous tissue were debrided. The tourniquet was released.  Hemostasis was achieved with the bipolar. Starting distally, the skin was intact with a large interrupted bites of 4-0 Prolene and this was worked from distal to proximal, sewing laceration loosely but nicely approximating the thumb tissue.  There remainder of the laceration was closed in this fashion.  The flap was actually pink at the conclusion of the procedure.  A small piece of Xeroform was placed up underneath the repaired eponychial fold and to protect the nail bed.  Further pieces of Xeroform were also placed  over the wound.  Sterile dressing and Ace wrap were placed.  The patient tolerated the procedure well and was taken to the recovery room in stable condition.     Johnette Abraham, MD     HCC/MEDQ  D:  07/17/2012  T:  07/18/2012  Job:  [id number]

## 2012-08-17 ENCOUNTER — Encounter: Payer: Self-pay | Admitting: Family Medicine

## 2012-08-17 ENCOUNTER — Ambulatory Visit (INDEPENDENT_AMBULATORY_CARE_PROVIDER_SITE_OTHER): Payer: BLUE CROSS/BLUE SHIELD | Admitting: Family Medicine

## 2012-08-17 VITALS — BP 130/90 | Temp 98.0°F | Wt 232.0 lb

## 2012-08-17 DIAGNOSIS — J340 Abscess, furuncle and carbuncle of nose: Secondary | ICD-10-CM

## 2012-08-17 DIAGNOSIS — J3489 Other specified disorders of nose and nasal sinuses: Secondary | ICD-10-CM

## 2012-08-17 MED ORDER — DOXYCYCLINE HYCLATE 100 MG PO TABS: 100.0000 mg | ORAL_TABLET | Freq: Two times a day (BID) | ORAL | Status: DC

## 2012-08-17 MED ORDER — MUPIROCIN 2 % EX OINT: TOPICAL_OINTMENT | Freq: Three times a day (TID) | CUTANEOUS | Status: DC

## 2012-08-17 NOTE — Progress Notes (Signed)
  Subjective:    Patient ID: Dylan Hamilton, male    DOB: 1961/03/24, 51 y.o.   MRN: [id number]  HPI  Patient seen with erythema, tenderness, and swelling tip of the nose and nostril area over the past couple days. Started 2 days ago. Possible chills last night but no fever documented. He has some mild headaches. No nausea or vomiting. Took 2 leftover Cipro yesterday and one today without improvement. No history of MRSA  History of type 2 diabetes. Not checking blood sugars past couple days but generally fairly well controlled.   Review of Systems  Constitutional: Positive for chills. Negative for fever.  Gastrointestinal: Negative for nausea and vomiting.  Psychiatric/Behavioral: Negative for confusion.       Objective:   Physical Exam  Constitutional: He appears well-developed and well-nourished.  HENT:  Right Ear: External ear normal.  Left Ear: External ear normal.  Mouth/Throat: Oropharynx is clear and moist.       Patient has some erythema and tenderness involving the distal tip of the nose. He has evidence for some erythema and slight pustular area involving right naris but no fluctuance. No vesicles. Left naris reveals no purulence.  Cardiovascular: Normal rate and regular rhythm.   Pulmonary/Chest: Effort normal and breath sounds normal. No respiratory distress. He has no wheezes. He has no rales.          Assessment & Plan:  Cellulitis nose. He has slight pustular change involving right naris but no fluctuant abscess. Start Bactroban ointment 2-3 times daily intranasal and doxycycline 100 mg twice a day to cover possible organisms such as MRSA. He is to followup immediately if he has any increased fever, vomiting, increased swelling, or increased redness. If any worsening refer to ENT though we did not see any clear evidence of abscess at this point. No visible purulent drainage.

## 2012-08-17 NOTE — Patient Instructions (Addendum)
Follow up promptly for any increasing fever, vomiting, increased swelling, or increased redness.

## 2012-08-30 ENCOUNTER — Ambulatory Visit (INDEPENDENT_AMBULATORY_CARE_PROVIDER_SITE_OTHER): Payer: BC Managed Care – PPO | Admitting: Family Medicine

## 2012-08-30 ENCOUNTER — Encounter: Payer: Self-pay | Admitting: Family Medicine

## 2012-08-30 VITALS — BP 130/80 | HR 72 | Temp 98.6°F | Resp 12 | Ht 72.5 in | Wt 237.0 lb

## 2012-08-30 DIAGNOSIS — Z0000 Encounter for general adult medical examination without abnormal findings: Secondary | ICD-10-CM | POA: Insufficient documentation

## 2012-08-30 DIAGNOSIS — E119 Type 2 diabetes mellitus without complications: Secondary | ICD-10-CM

## 2012-08-30 LAB — CBC WITH DIFFERENTIAL/PLATELET
Basophils Absolute: 0 10*3/uL (ref 0.0–0.1)
Basophils Relative: 0.4 % (ref 0.0–3.0)
Eosinophils Absolute: 0.3 10*3/uL (ref 0.0–0.7)
Eosinophils Relative: 3.4 % (ref 0.0–5.0)
HCT: 40.8 % (ref 39.0–52.0)
Hemoglobin: 13.9 g/dL (ref 13.0–17.0)
Lymphocytes Relative: 24.4 % (ref 12.0–46.0)
Lymphs Abs: 2.3 10*3/uL (ref 0.7–4.0)
MCHC: 34.1 g/dL (ref 30.0–36.0)
MCV: 90.6 fl (ref 78.0–100.0)
Monocytes Absolute: 0.9 10*3/uL (ref 0.1–1.0)
Monocytes Relative: 10 % (ref 3.0–12.0)
Neutro Abs: 5.8 10*3/uL (ref 1.4–7.7)
Neutrophils Relative %: 61.8 % (ref 43.0–77.0)
Platelets: 309 10*3/uL (ref 150.0–400.0)
RBC: 4.5 Mil/uL (ref 4.22–5.81)
RDW: 12.4 % (ref 11.5–14.6)
WBC: 9.3 10*3/uL (ref 4.5–10.5)

## 2012-08-30 LAB — BASIC METABOLIC PANEL
BUN: 14 mg/dL (ref 6–23)
CO2: 24 mEq/L (ref 19–32)
Calcium: 9.2 mg/dL (ref 8.4–10.5)
Chloride: 102 mEq/L (ref 96–112)
Creatinine, Ser: 0.8 mg/dL (ref 0.4–1.5)
GFR: 105.82 mL/min (ref >60.00)
Glucose, Bld: 186 mg/dL — ABNORMAL HIGH (ref 70–99)
Potassium: 4.5 mEq/L (ref 3.5–5.1)
Sodium: 135 mEq/L (ref 135–145)

## 2012-08-30 LAB — HEMOGLOBIN A1C: Hgb A1c MFr Bld: 8.5 % — ABNORMAL HIGH (ref 4.6–6.5)

## 2012-08-30 LAB — HEPATIC FUNCTION PANEL
ALT: 50 U/L (ref 0–53)
AST: 40 U/L — ABNORMAL HIGH (ref 0–37)
Albumin: 4.1 g/dL (ref 3.5–5.2)
Alkaline Phosphatase: 50 U/L (ref 39–117)
Bilirubin, Direct: 0.1 mg/dL (ref 0.0–0.3)
Total Bilirubin: 1 mg/dL (ref 0.3–1.2)
Total Protein: 7.3 g/dL (ref 6.0–8.3)

## 2012-08-30 LAB — LIPID PANEL
Cholesterol: 144 mg/dL (ref 0–200)
HDL: 36.2 mg/dL — ABNORMAL LOW (ref >39.00)
Total CHOL/HDL Ratio: 4
Triglycerides: 314 mg/dL — ABNORMAL HIGH (ref 0.0–149.0)
VLDL: 62.8 mg/dL — ABNORMAL HIGH (ref 0.0–40.0)

## 2012-08-30 LAB — TSH: TSH: 1.92 u[IU]/mL (ref 0.35–5.50)

## 2012-08-30 LAB — LDL CHOLESTEROL, DIRECT: Direct LDL: 65.3 mg/dL

## 2012-08-30 LAB — MICROALBUMIN / CREATININE URINE RATIO
Creatinine,U: 140.4 mg/dL
Microalb Creat Ratio: 0.7 mg/g (ref 0.0–30.0)
Microalb, Ur: 1 mg/dL (ref 0.0–1.9)

## 2012-08-30 LAB — PSA: PSA: 0.88 ng/mL (ref 0.10–4.00)

## 2012-08-30 NOTE — Progress Notes (Signed)
Subjective:    Patient ID: Dylan Hamilton, male    DOB: 08/18/1961, 52 y.o.   MRN: [id number]  HPI Patient here for complete physical. Has chronic problems include history of type 2 diabetes, hyperlipidemia. Blood sugars have not been extremely well controlled. He appears to have good late day sugars with fastings frequently around 180. Last A1c 7.9%. No history of screening colonoscopy. Recent extensive injury to right thumb. Followed closely by hand surgeon and this is healing well. Tetanus was 2013. Patient declines flu vaccine. Compliant with medications. No hypoglycemia.  Past Medical History  Diagnosis Date  . Diabetes mellitus type II   . Hyperlipidemia   . Hypercholesterolemia    Past Surgical History  Procedure Date  . I&d extremity 07/17/2012    Procedure: IRRIGATION AND DEBRIDEMENT EXTREMITY;  Surgeon: Johnette Abraham, MD;  Location: [facility] OR;  Service: Plastics;  Laterality: Right;  Repair Right thumb laceration    reports that he has never smoked. He does not have any smokeless tobacco history on file. He reports that he does not drink alcohol or use illicit drugs. family history includes Cancer in his father and other; Diabetes in his other; and Stroke in his paternal grandfather. No Known Allergies   Review of Systems  Constitutional: Negative for fever, activity change, appetite change and fatigue.  HENT: Negative for ear pain, congestion and trouble swallowing.   Eyes: Negative for pain and visual disturbance.  Respiratory: Negative for cough, shortness of breath and wheezing.   Cardiovascular: Negative for chest pain and palpitations.  Gastrointestinal: Negative for nausea, vomiting, abdominal pain, diarrhea, constipation, blood in stool, abdominal distention and rectal pain.  Genitourinary: Negative for dysuria, hematuria and testicular pain.  Musculoskeletal: Negative for joint swelling and arthralgias.  Skin: Negative for rash.  Neurological: Negative for dizziness,  syncope and headaches.  Hematological: Negative for adenopathy.  Psychiatric/Behavioral: Negative for confusion and dysphoric mood.       Objective:   Physical Exam  Constitutional: He is oriented to person, place, and time. He appears well-developed and well-nourished. No distress.  HENT:  Head: Normocephalic and atraumatic.  Right Ear: External ear normal.  Left Ear: External ear normal.  Mouth/Throat: Oropharynx is clear and moist.  Eyes: Conjunctivae normal and EOM are normal. Pupils are equal, round, and reactive to light.  Neck: Normal range of motion. Neck supple. No thyromegaly present.  Cardiovascular: Normal rate, regular rhythm and normal heart sounds.   No murmur heard. Pulmonary/Chest: No respiratory distress. He has no wheezes. He has no rales.  Abdominal: Soft. Bowel sounds are normal. He exhibits no distension and no mass. There is no tenderness. There is no rebound and no guarding.  Genitourinary: Rectum normal and prostate normal.  Musculoskeletal: He exhibits no edema.  Lymphadenopathy:    He has no cervical adenopathy.  Neurological: He is alert and oriented to person, place, and time. He displays normal reflexes. No cranial nerve deficit.  Skin: No rash noted.       Feet reveal no skin lesions. Good distal foot pulses. Good capillary refill. No calluses. Normal sensation with monofilament testing Dystrophic changes left great toenail and third toe   Psychiatric: He has a normal mood and affect.          Assessment & Plan:  #1 Complete physical. Schedule screening colonoscopy. Schedule screening lab work including PSA. Flu vaccine declined. Tetanus up-to-date #2 type 2 diabetes. History of poor control. Recheck A1c. Patient is very reluctant to consider insulin options at  this time.

## 2012-09-01 ENCOUNTER — Encounter: Payer: Self-pay | Admitting: Gastroenterology

## 2012-09-01 NOTE — Progress Notes (Signed)
Quick Note:  Pt informed on personally cell VM ______

## 2012-09-12 ENCOUNTER — Other Ambulatory Visit: Payer: Self-pay | Admitting: Family Medicine

## 2012-10-03 ENCOUNTER — Other Ambulatory Visit: Payer: Self-pay | Admitting: Family Medicine

## 2012-10-11 ENCOUNTER — Ambulatory Visit (AMBULATORY_SURGERY_CENTER): Payer: BC Managed Care – PPO | Admitting: *Deleted

## 2012-10-11 VITALS — Ht 72.0 in | Wt 233.6 lb

## 2012-10-11 DIAGNOSIS — Z1211 Encounter for screening for malignant neoplasm of colon: Secondary | ICD-10-CM

## 2012-10-11 MED ORDER — NA SULFATE-K SULFATE-MG SULF 17.5-3.13-1.6 GM/177ML PO SOLN: 1.0000 | Freq: Once | ORAL | Status: DC

## 2012-10-11 NOTE — Progress Notes (Signed)
No egg or soy allergy. No problems with sedation in the past. ewm 

## 2012-10-12 ENCOUNTER — Encounter: Payer: Self-pay | Admitting: Gastroenterology

## 2012-10-20 ENCOUNTER — Other Ambulatory Visit: Payer: Self-pay | Admitting: Gastroenterology

## 2012-10-20 ENCOUNTER — Encounter: Payer: Self-pay | Admitting: Gastroenterology

## 2012-10-20 ENCOUNTER — Ambulatory Visit (AMBULATORY_SURGERY_CENTER): Payer: BC Managed Care – PPO | Admitting: Gastroenterology

## 2012-10-20 VITALS — BP 146/75 | HR 58 | Temp 98.4°F | Resp 13 | Ht 72.0 in | Wt 233.0 lb

## 2012-10-20 DIAGNOSIS — Z1211 Encounter for screening for malignant neoplasm of colon: Secondary | ICD-10-CM

## 2012-10-20 LAB — GLUCOSE, CAPILLARY
Glucose-Capillary: 146 mg/dL — ABNORMAL HIGH (ref 70–99)
Glucose-Capillary: 101 mg/dL — ABNORMAL HIGH (ref 70–99)

## 2012-10-20 MED ORDER — SODIUM CHLORIDE 0.9 % IV SOLN: 500.0000 mL | INTRAVENOUS | Status: DC

## 2012-10-20 NOTE — Op Note (Signed)
[facility] [address] [place], [address]   COLONOSCOPY PROCEDURE REPORT  PATIENT: Arn, Mcomber  MR#: [id number] BIRTHDATE: 07-Jun-1961 , 51  yrs. old GENDER: Male ENDOSCOPIST: Louis Meckel, MD REFERRED XB:JYNWG Caryl Never, M.D. PROCEDURE DATE:  10/20/2012 PROCEDURE:   Colonoscopy, diagnostic ASA CLASS:   Class II INDICATIONS:average risk screening. MEDICATIONS: MAC sedation, administered by CRNA and Propofol (Diprivan) 230 mg IV  DESCRIPTION OF PROCEDURE:   After the risks benefits and alternatives of the procedure were thoroughly explained, informed consent was obtained.  A digital rectal exam revealed no abnormalities of the rectum.   The LB CF-[id number]  endoscope was introduced through the anus and advanced to the cecum, which was identified by both the appendix and ileocecal valve. No adverse events experienced.   The quality of the prep was Suprep excellent The instrument was then slowly withdrawn as the colon was fully examined.      COLON FINDINGS: A normal appearing cecum, ileocecal valve, and appendiceal orifice were identified.  The ascending, hepatic flexure, transverse, splenic flexure, descending, sigmoid colon and rectum appeared unremarkable.  No polyps or cancers were seen. Retroflexed views revealed no abnormalities. The time to cecum=5 minutes 58 seconds.  Withdrawal time=11 minutes 57 seconds.  The scope was withdrawn and the procedure completed. COMPLICATIONS: There were no complications.  ENDOSCOPIC IMPRESSION: Normal colon  RECOMMENDATIONS: Continue current colorectal screening recommendations for "routine risk" patients with a repeat colonoscopy in 10 years.   eSigned:  Louis Meckel, MD 10/20/2012 11:16 AM   cc:

## 2012-10-20 NOTE — Progress Notes (Signed)
Patient did not experience any of the following events: a burn prior to discharge; a fall within the facility; wrong site/side/patient/procedure/implant event; or a hospital transfer or hospital admission upon discharge from the facility. (G8907) Patient did not have preoperative order for IV antibiotic SSI prophylaxis. (G8918)  

## 2012-10-20 NOTE — Patient Instructions (Addendum)
YOU HAD AN ENDOSCOPIC PROCEDURE TODAY AT THE [facility]: Refer to the procedure report that was given to you for any specific questions about what was found during the examination.  If the procedure report does not answer your questions, please call your gastroenterologist to clarify.  If you requested that your care partner not be given the details of your procedure findings, then the procedure report has been included in a sealed envelope for you to review at your convenience later.  YOU SHOULD EXPECT: Some feelings of bloating in the abdomen. Passage of more gas than usual.  Walking can help get rid of the air that was put into your GI tract during the procedure and reduce the bloating. If you had a lower endoscopy (such as a colonoscopy or flexible sigmoidoscopy) you may notice spotting of blood in your stool or on the toilet paper. If you underwent a bowel prep for your procedure, then you may not have a normal bowel movement for a few days.  DIET: Your first meal following the procedure should be a light meal and then it is ok to progress to your normal diet.  A half-sandwich or bowl of soup is an example of a good first meal.  Heavy or fried foods are harder to digest and may make you feel nauseous or bloated.  Likewise meals heavy in dairy and vegetables can cause extra gas to form and this can also increase the bloating.  Drink plenty of fluids but you should avoid alcoholic beverages for 24 hours.  ACTIVITY: Your care partner should take you home directly after the procedure.  You should plan to take it easy, moving slowly for the rest of the day.  You can resume normal activity the day after the procedure however you should NOT DRIVE or use heavy machinery for 24 hours (because of the sedation medicines used during the test).    SYMPTOMS TO REPORT IMMEDIATELY: A gastroenterologist can be reached at any hour.  During normal business hours, 8:30 AM to 5:00 PM Monday through Friday,  call [phone].  After hours and on weekends, please call the GI answering service at [phone] who will take a message and have the physician on call contact you.   Following lower endoscopy (colonoscopy or flexible sigmoidoscopy):  Excessive amounts of blood in the stool  Significant tenderness or worsening of abdominal pains  Swelling of the abdomen that is new, acute  Fever of 100F or higher    FOLLOW UP: If any biopsies were taken you will be contacted by phone or by letter within the next 1-3 weeks.  Call your gastroenterologist if you have not heard about the biopsies in 3 weeks.  Our staff will call the home number listed on your records the next business day following your procedure to check on you and address any questions or concerns that you may have at that time regarding the information given to you following your procedure. This is a courtesy call and so if there is no answer at the home number and we have not heard from you through the emergency physician on call, we will assume that you have returned to your regular daily activities without incident.  SIGNATURES/CONFIDENTIALITY: You and/or your care partner have signed paperwork which will be entered into your electronic medical record.  These signatures attest to the fact that that the information above on your After Visit Summary has been reviewed and is understood.  Full responsibility of the confidentiality   of this discharge information lies with you and/or your care-partner.     

## 2012-10-20 NOTE — Progress Notes (Signed)
A/0x3 pleased with MAC report to [facility]

## 2012-10-23 ENCOUNTER — Telehealth: Payer: Self-pay | Admitting: *Deleted

## 2012-10-23 NOTE — Telephone Encounter (Signed)
  Follow up Call-  Call back number 10/20/2012  Post procedure Call Back phone  # [phone]  Permission to leave phone message Yes     Patient questions:  Do you have a fever, pain , or abdominal swelling? no Pain Score  0 *  Have you tolerated food without any problems? yes  Have you been able to return to your normal activities? yes  Do you have any questions about your discharge instructions: Diet   no Medications  no Follow up visit  no  Do you have questions or concerns about your Care? no  Actions: * If pain score is 4 or above: No action needed, pain <4.

## 2012-11-14 ENCOUNTER — Other Ambulatory Visit: Payer: Self-pay | Admitting: Family Medicine

## 2013-03-06 LAB — HM DIABETES EYE EXAM

## 2013-08-19 IMAGING — CR DG FINGER THUMB 2+V*R*
3 series · 3 of 3 positions shown · non-contrast
Comparison: None.

CLINICAL DATA: Right thumb injury.

RIGHT THUMB 2+V

[x finger pa right]
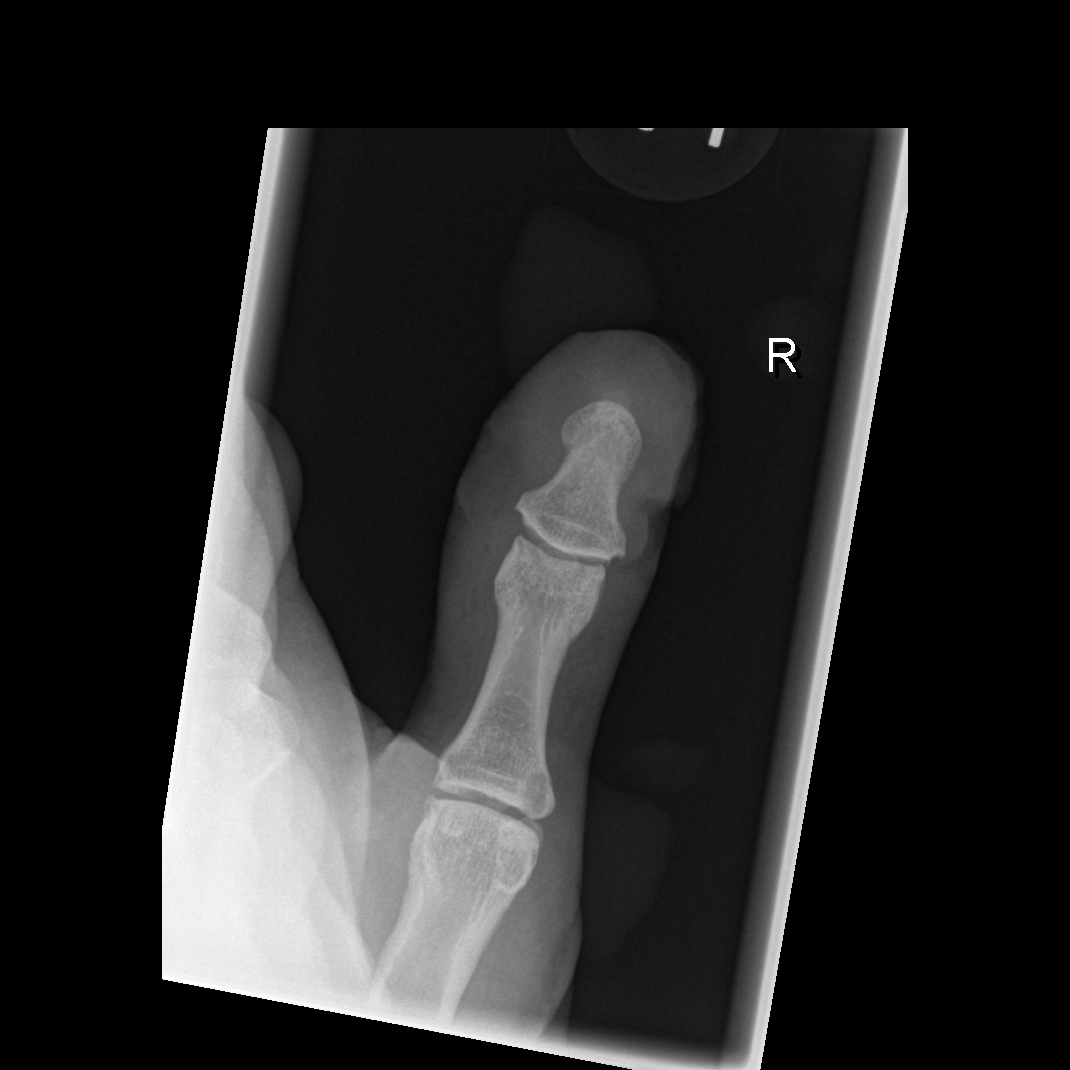

[x finger obl. right]
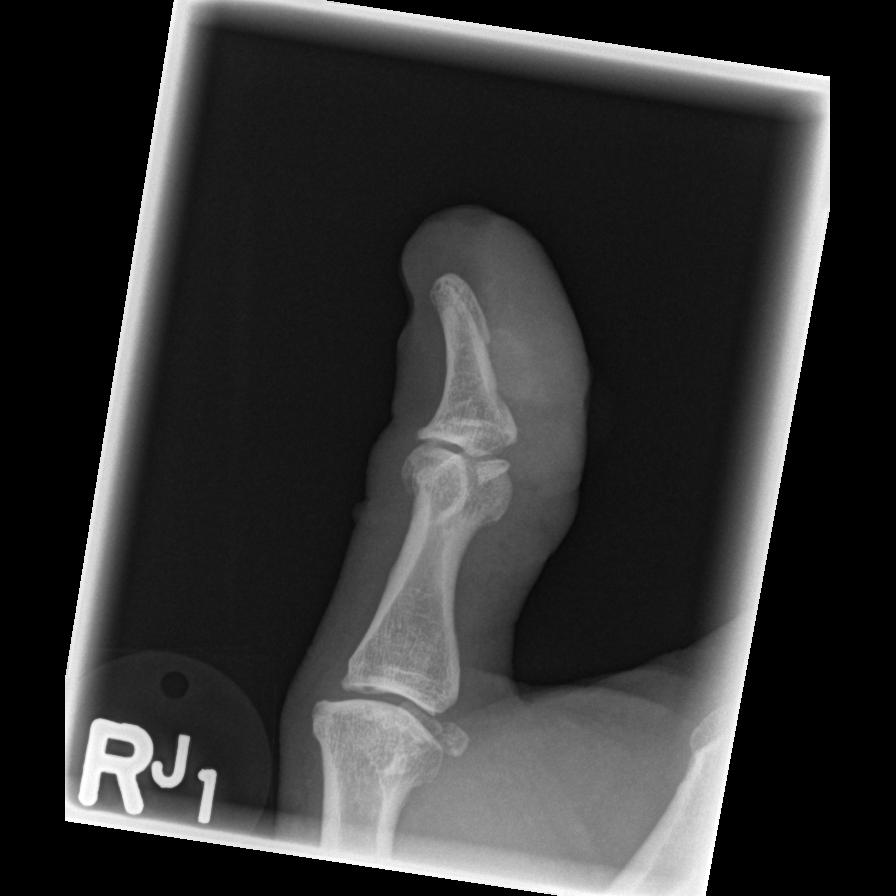

[x finger lateral right]
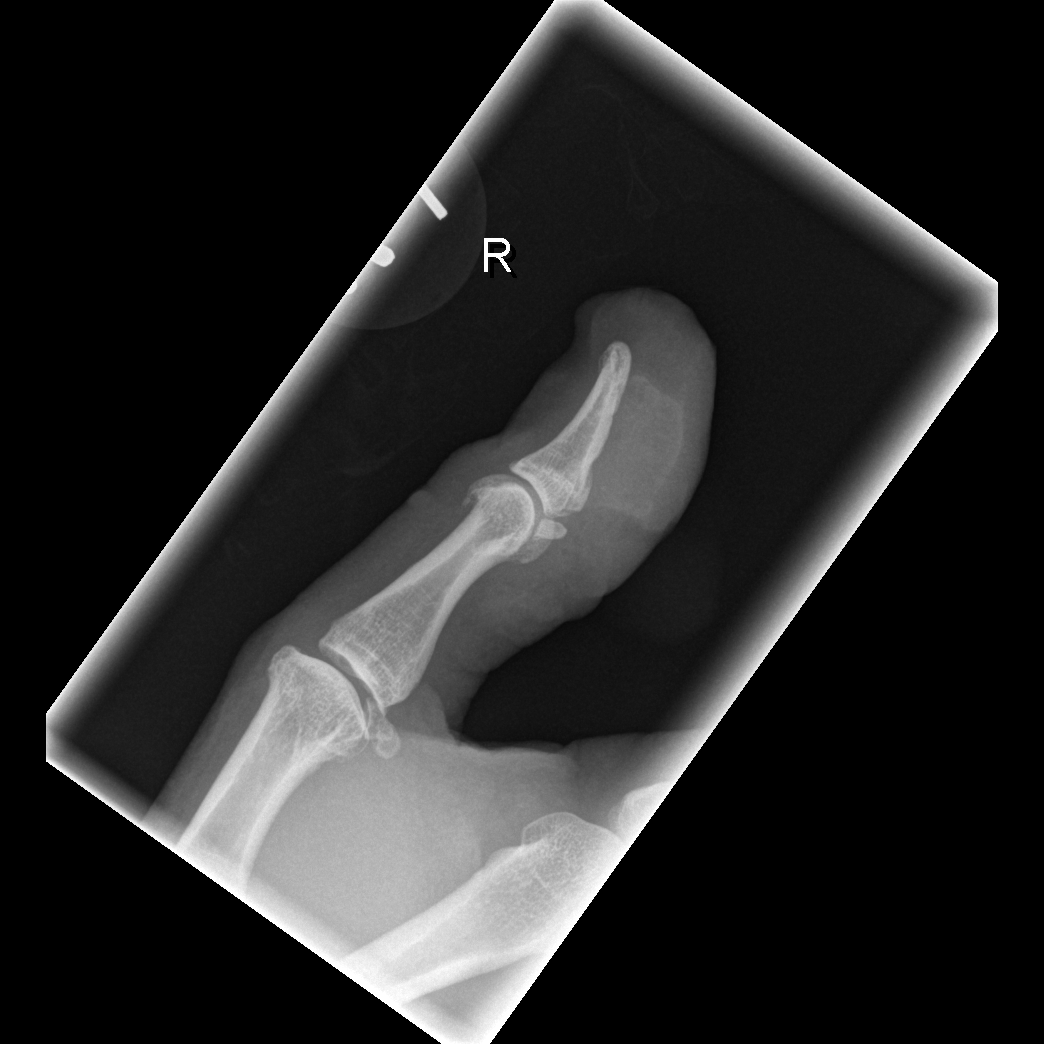

[3 of 3 positions shown; findings below may reference images not displayed]

FINDINGS: Three views of the thumb demonstrate soft tissue swelling
along the tip.  No evidence for acute fracture or dislocation.
There are areas of spurring involving the right thumb.
IMPRESSION: Soft tissue swelling without acute fracture.

Mild degenerative changes.

## 2013-10-05 ENCOUNTER — Other Ambulatory Visit: Payer: Self-pay | Admitting: Family Medicine

## 2013-10-08 ENCOUNTER — Other Ambulatory Visit: Payer: Self-pay | Admitting: Family Medicine

## 2013-10-10 ENCOUNTER — Telehealth: Payer: Self-pay | Admitting: Family Medicine

## 2013-10-10 MED ORDER — METFORMIN HCL 500 MG PO TABS: ORAL_TABLET | ORAL | Status: DC

## 2013-10-10 NOTE — Telephone Encounter (Signed)
Rx sent to pharmacy. Only #60 patient has not been seen since 08/2012

## 2013-10-10 NOTE — Telephone Encounter (Signed)
Pt is requesting new rx metFORMIN (GLUCOPHAGE) 500 MG tablet, [facility] [address],

## 2013-10-17 ENCOUNTER — Encounter: Payer: Self-pay | Admitting: Family Medicine

## 2013-10-17 ENCOUNTER — Ambulatory Visit (INDEPENDENT_AMBULATORY_CARE_PROVIDER_SITE_OTHER): Payer: BC Managed Care – PPO | Admitting: Family Medicine

## 2013-10-17 VITALS — BP 132/72 | HR 75 | Temp 98.2°F | Ht 72.0 in | Wt 234.0 lb

## 2013-10-17 DIAGNOSIS — Z0000 Encounter for general adult medical examination without abnormal findings: Secondary | ICD-10-CM

## 2013-10-17 LAB — CBC WITH DIFFERENTIAL/PLATELET
Basophils Absolute: 0 10*3/uL (ref 0.0–0.1)
Basophils Relative: 0.4 % (ref 0.0–3.0)
Eosinophils Absolute: 0.2 10*3/uL (ref 0.0–0.7)
Eosinophils Relative: 2.5 % (ref 0.0–5.0)
HCT: 44.3 % (ref 39.0–52.0)
Hemoglobin: 14.6 g/dL (ref 13.0–17.0)
Lymphocytes Relative: 25.3 % (ref 12.0–46.0)
Lymphs Abs: 2.2 10*3/uL (ref 0.7–4.0)
MCHC: 33 g/dL (ref 30.0–36.0)
MCV: 93.3 fl (ref 78.0–100.0)
Monocytes Absolute: 0.9 10*3/uL (ref 0.1–1.0)
Monocytes Relative: 10 % (ref 3.0–12.0)
Neutro Abs: 5.3 10*3/uL (ref 1.4–7.7)
Neutrophils Relative %: 61.8 % (ref 43.0–77.0)
Platelets: 336 10*3/uL (ref 150.0–400.0)
RBC: 4.74 Mil/uL (ref 4.22–5.81)
RDW: 13 % (ref 11.5–14.6)
WBC: 8.6 10*3/uL (ref 4.5–10.5)

## 2013-10-17 LAB — LIPID PANEL
Cholesterol: 142 mg/dL (ref 0–200)
HDL: 37.7 mg/dL — ABNORMAL LOW (ref >39.00)
Total CHOL/HDL Ratio: 4
Triglycerides: 265 mg/dL — ABNORMAL HIGH (ref 0.0–149.0)
VLDL: 53 mg/dL — ABNORMAL HIGH (ref 0.0–40.0)

## 2013-10-17 LAB — HEPATIC FUNCTION PANEL
ALT: 56 U/L — ABNORMAL HIGH (ref 0–53)
AST: 58 U/L — ABNORMAL HIGH (ref 0–37)
Albumin: 4.1 g/dL (ref 3.5–5.2)
Alkaline Phosphatase: 52 U/L (ref 39–117)
Bilirubin, Direct: 0.1 mg/dL (ref 0.0–0.3)
Total Bilirubin: 0.8 mg/dL (ref 0.3–1.2)
Total Protein: 7.6 g/dL (ref 6.0–8.3)

## 2013-10-17 LAB — BASIC METABOLIC PANEL
BUN: 10 mg/dL (ref 6–23)
CO2: 28 mEq/L (ref 19–32)
Calcium: 9.3 mg/dL (ref 8.4–10.5)
Chloride: 103 mEq/L (ref 96–112)
Creatinine, Ser: 0.8 mg/dL (ref 0.4–1.5)
GFR: 102.44 mL/min (ref >60.00)
Glucose, Bld: 167 mg/dL — ABNORMAL HIGH (ref 70–99)
Potassium: 4.6 mEq/L (ref 3.5–5.1)
Sodium: 138 mEq/L (ref 135–145)

## 2013-10-17 LAB — MICROALBUMIN / CREATININE URINE RATIO
Creatinine,U: 104.1 mg/dL
Microalb Creat Ratio: 0.2 mg/g (ref 0.0–30.0)
Microalb, Ur: 0.2 mg/dL (ref 0.0–1.9)

## 2013-10-17 LAB — TSH: TSH: 2.27 u[IU]/mL (ref 0.35–5.50)

## 2013-10-17 LAB — LDL CHOLESTEROL, DIRECT: Direct LDL: 72 mg/dL

## 2013-10-17 LAB — HEMOGLOBIN A1C: Hgb A1c MFr Bld: 8.5 % — ABNORMAL HIGH (ref 4.6–6.5)

## 2013-10-17 LAB — PSA: PSA: 0.71 ng/mL (ref 0.10–4.00)

## 2013-10-17 MED ORDER — SIMVASTATIN 20 MG PO TABS: 20.0000 mg | ORAL_TABLET | Freq: Every day | ORAL | Status: DC

## 2013-10-17 MED ORDER — METFORMIN HCL 500 MG PO TABS: ORAL_TABLET | ORAL | Status: DC

## 2013-10-17 MED ORDER — GLIMEPIRIDE 4 MG PO TABS: 4.0000 mg | ORAL_TABLET | Freq: Every day | ORAL | Status: DC

## 2013-10-17 MED ORDER — GLUCOSE BLOOD VI STRP: 1.0000 | ORAL_STRIP | Freq: Every day | Status: DC

## 2013-10-17 NOTE — Progress Notes (Signed)
Pre visit review using our clinic review tool, if applicable. No additional management support is needed unless otherwise documented below in the visit note. 

## 2013-10-17 NOTE — Patient Instructions (Signed)
Try to lose some weight.  We will call you with labs and we will likely be adding additional diabetes medications.

## 2013-10-17 NOTE — Progress Notes (Signed)
Subjective:    Patient ID: Dylan Hamilton, male    DOB: 07/28/1961, 53 y.o.   MRN: [id number]  HPI Patient seen for complete physical examination He has history of type 2 diabetes with somewhat poor control. No recent followup. Fasting blood sugars run 150-200. Lately blood sugars around 80-120. Takes metformin and Amaryl and is compliant with medications. No symptoms of hyperglycemia. Takes simvastatin for hyperlipidemia. His tetanus is up-to-date. It colonoscopy last year. Eye exam last July.  Family history reviewed and as below. He does not get any consistent exercise. Nonsmoker.  Past Medical History  Diagnosis Date  . Diabetes mellitus type II   . Hyperlipidemia   . Hypercholesterolemia    Past Surgical History  Procedure Laterality Date  . I&d extremity  07/17/2012    Procedure: IRRIGATION AND DEBRIDEMENT EXTREMITY;  Surgeon: Johnette Abraham, MD;  Location: [facility] OR;  Service: Plastics;  Laterality: Right;  Repair Right thumb laceration  . Inguinal hernia repair  1991  . Wisdom tooth extraction      reports that he has never smoked. He has never used smokeless tobacco. He reports that he does not drink alcohol or use illicit drugs. family history includes Cancer in his father and other; Diabetes in his brother, mother, and other; Stroke in his paternal grandfather. There is no history of Colon cancer, Rectal cancer, or Stomach cancer. No Known Allergies    Review of Systems  Constitutional: Positive for fatigue. Negative for fever, activity change and appetite change.  HENT: Negative for congestion, ear pain and trouble swallowing.   Eyes: Negative for pain and visual disturbance.  Respiratory: Negative for cough, shortness of breath and wheezing.   Cardiovascular: Negative for chest pain and palpitations.  Gastrointestinal: Negative for nausea, vomiting, abdominal pain, diarrhea, constipation, blood in stool, abdominal distention and rectal pain.  Endocrine: Negative for  polydipsia and polyuria.  Genitourinary: Negative for dysuria, hematuria and testicular pain.  Musculoskeletal: Negative for arthralgias and joint swelling.  Skin: Negative for rash.  Neurological: Negative for dizziness, syncope and headaches.  Hematological: Negative for adenopathy.  Psychiatric/Behavioral: Negative for confusion and dysphoric mood.       Objective:   Physical Exam  Constitutional: He is oriented to person, place, and time. He appears well-developed and well-nourished. No distress.  HENT:  Head: Normocephalic and atraumatic.  Right Ear: External ear normal.  Left Ear: External ear normal.  Mouth/Throat: Oropharynx is clear and moist.  Eyes: Conjunctivae and EOM are normal. Pupils are equal, round, and reactive to light.  Neck: Normal range of motion. Neck supple. No thyromegaly present.  Cardiovascular: Normal rate, regular rhythm and normal heart sounds.   No murmur heard. Pulmonary/Chest: No respiratory distress. He has no wheezes. He has no rales.  Abdominal: Soft. Bowel sounds are normal. He exhibits no distension and no mass. There is no tenderness. There is no rebound and no guarding.  Musculoskeletal: He exhibits no edema.  Lymphadenopathy:    He has no cervical adenopathy.  Neurological: He is alert and oriented to person, place, and time. He displays normal reflexes. No cranial nerve deficit.  Skin: No rash noted.  No concerning skin lesions  Psychiatric: He has a normal mood and affect.          Assessment & Plan:  Complete physical. We discussed the importance of weight loss and weight control and regular exercise. Colonoscopy up to date. Tetanus up-to-date. Continue yearly eye exams. Obtain lab work including hemoglobin A1c and urine microalbumin  screen. If diabetes remains poorly controlled consider addition of Invokana or ComorosFarxiga.

## 2013-10-19 ENCOUNTER — Other Ambulatory Visit: Payer: Self-pay

## 2013-10-19 MED ORDER — CANAGLIFLOZIN 300 MG PO TABS: 300.0000 mg | ORAL_TABLET | Freq: Every day | ORAL | Status: DC

## 2013-10-22 ENCOUNTER — Telehealth: Payer: Self-pay | Admitting: Family Medicine

## 2013-10-22 NOTE — Telephone Encounter (Signed)
We have coupons that should work for any insurance (I was told).  Let's get him coupon which should reduce his portion to 0.

## 2013-10-22 NOTE — Telephone Encounter (Signed)
Pt is calling regarding rx Canagliflozin (INVOKANA) 300 MG TABS, pt states the rx is very expensive and want to know if there is something else compatible that he can get. [facility] [address]. Pt states he did not get the rx yet.

## 2013-10-22 NOTE — Telephone Encounter (Signed)
Pt informed that the coupon in ready for pick up

## 2014-01-15 ENCOUNTER — Ambulatory Visit (INDEPENDENT_AMBULATORY_CARE_PROVIDER_SITE_OTHER): Payer: BC Managed Care – PPO | Admitting: Family Medicine

## 2014-01-15 ENCOUNTER — Encounter: Payer: Self-pay | Admitting: Family Medicine

## 2014-01-15 VITALS — BP 134/80 | HR 66 | Temp 97.6°F | Wt 231.0 lb

## 2014-01-15 DIAGNOSIS — R7401 Elevation of levels of liver transaminase levels: Secondary | ICD-10-CM

## 2014-01-15 DIAGNOSIS — R7402 Elevation of levels of lactic acid dehydrogenase (LDH): Secondary | ICD-10-CM

## 2014-01-15 DIAGNOSIS — R740 Nonspecific elevation of levels of transaminase and lactic acid dehydrogenase [LDH]: Secondary | ICD-10-CM

## 2014-01-15 DIAGNOSIS — E119 Type 2 diabetes mellitus without complications: Secondary | ICD-10-CM

## 2014-01-15 LAB — HEPATIC FUNCTION PANEL
ALT: 31 U/L (ref 0–53)
AST: 29 U/L (ref 0–37)
Albumin: 4.1 g/dL (ref 3.5–5.2)
Alkaline Phosphatase: 38 U/L — ABNORMAL LOW (ref 39–117)
Bilirubin, Direct: 0.1 mg/dL (ref 0.0–0.3)
Total Bilirubin: 0.9 mg/dL (ref 0.2–1.2)
Total Protein: 7.5 g/dL (ref 6.0–8.3)

## 2014-01-15 LAB — HEMOGLOBIN A1C: Hgb A1c MFr Bld: 7.4 % — ABNORMAL HIGH (ref 4.6–6.5)

## 2014-01-15 NOTE — Progress Notes (Signed)
Pre visit review using our clinic review tool, if applicable. No additional management support is needed unless otherwise documented below in the visit note. 

## 2014-01-15 NOTE — Progress Notes (Signed)
   Subjective:    Patient ID: Dylan Hamilton, male    DOB: [birth year], 53 y.o.   MRN: [id number]  HPI Followup type 2 diabetes. History of recent poor control. We added Invokana last visit and he has tolerated well no side effects. His blood sugars have improved. Fasting blood sugars now run 130 and occasionally less. He has lost a few pounds since last visit. No hypoglycemia. He also remains on metformin and Amaryl along with simvastatin for hyperlipidemia. His blood pressures have been stable.  Mildly elevated liver transaminases. He takes simvastatin. Rare alcohol use. No history of any transfusions. No risk factors for hepatitis C.  Past Medical History  Diagnosis Date  . Diabetes mellitus type II   . Hyperlipidemia   . Hypercholesterolemia    Past Surgical History  Procedure Laterality Date  . I&d extremity  07/17/2012    Procedure: IRRIGATION AND DEBRIDEMENT EXTREMITY;  Surgeon: Johnette Abraham, MD;  Location: [facility] OR;  Service: Plastics;  Laterality: Right;  Repair Right thumb laceration  . Inguinal hernia repair  1991  . Wisdom tooth extraction      reports that he has never smoked. He has never used smokeless tobacco. He reports that he does not drink alcohol or use illicit drugs. family history includes Cancer in his brother, father, and other; Diabetes in his brother, mother, and other; Stroke in his paternal grandfather. There is no history of Colon cancer, Rectal cancer, or Stomach cancer. No Known Allergies    Review of Systems  Constitutional: Negative for fatigue.  Eyes: Negative for visual disturbance.  Respiratory: Negative for cough, chest tightness and shortness of breath.   Cardiovascular: Negative for chest pain, palpitations and leg swelling.  Neurological: Negative for dizziness, syncope, weakness, light-headedness and headaches.       Objective:   Physical Exam  Constitutional: He appears well-developed and well-nourished.  Neck: Neck supple. No thyromegaly  present.  Cardiovascular: Normal rate and regular rhythm.   No murmur heard. Pulmonary/Chest: Effort normal and breath sounds normal. No respiratory distress. He has no wheezes. He has no rales.  Musculoskeletal: He exhibits no edema.          Assessment & Plan:  Type 2 diabetes. History of poor control. Recently improved by home readings. Recheck A1c. Also repeat hepatic panel with mildly elevated liver transaminases recently. We've recommended continue weight loss efforts. Reassess 3 months

## 2014-04-22 ENCOUNTER — Other Ambulatory Visit: Payer: Self-pay | Admitting: Family Medicine

## 2014-10-15 ENCOUNTER — Other Ambulatory Visit (INDEPENDENT_AMBULATORY_CARE_PROVIDER_SITE_OTHER): Payer: Self-pay

## 2014-10-15 DIAGNOSIS — Z0000 Encounter for general adult medical examination without abnormal findings: Secondary | ICD-10-CM

## 2014-10-15 LAB — CBC WITH DIFFERENTIAL/PLATELET
Basophils Absolute: 0 10*3/uL (ref 0.0–0.1)
Basophils Relative: 0.5 % (ref 0.0–3.0)
Eosinophils Absolute: 0.2 10*3/uL (ref 0.0–0.7)
Eosinophils Relative: 2.2 % (ref 0.0–5.0)
HCT: 45.4 % (ref 39.0–52.0)
Hemoglobin: 15.8 g/dL (ref 13.0–17.0)
Lymphocytes Relative: 28.3 % (ref 12.0–46.0)
Lymphs Abs: 2.6 10*3/uL (ref 0.7–4.0)
MCHC: 34.8 g/dL (ref 30.0–36.0)
MCV: 90 fl (ref 78.0–100.0)
Monocytes Absolute: 0.9 10*3/uL (ref 0.1–1.0)
Monocytes Relative: 9.8 % (ref 3.0–12.0)
Neutro Abs: 5.3 10*3/uL (ref 1.4–7.7)
Neutrophils Relative %: 59.2 % (ref 43.0–77.0)
Platelets: 286 10*3/uL (ref 150.0–400.0)
RBC: 5.04 Mil/uL (ref 4.22–5.81)
RDW: 13.1 % (ref 11.5–15.5)
WBC: 9 10*3/uL (ref 4.0–10.5)

## 2014-10-15 LAB — LIPID PANEL
Cholesterol: 172 mg/dL (ref 0–200)
HDL: 37.2 mg/dL — ABNORMAL LOW (ref >39.00)
NonHDL: 134.8
Total CHOL/HDL Ratio: 5
Triglycerides: 364 mg/dL — ABNORMAL HIGH (ref 0.0–149.0)
VLDL: 72.8 mg/dL — ABNORMAL HIGH (ref 0.0–40.0)

## 2014-10-15 LAB — COMPREHENSIVE METABOLIC PANEL
ALT: 22 U/L (ref 0–53)
AST: 18 U/L (ref 0–37)
Albumin: 4.3 g/dL (ref 3.5–5.2)
Alkaline Phosphatase: 47 U/L (ref 39–117)
BUN: 16 mg/dL (ref 6–23)
CO2: 26 mEq/L (ref 19–32)
Calcium: 9.6 mg/dL (ref 8.4–10.5)
Chloride: 102 mEq/L (ref 96–112)
Creatinine, Ser: 0.89 mg/dL (ref 0.40–1.50)
GFR: 94.17 mL/min (ref >60.00)
Glucose, Bld: 156 mg/dL — ABNORMAL HIGH (ref 70–99)
Potassium: 4.5 mEq/L (ref 3.5–5.1)
Sodium: 136 mEq/L (ref 135–145)
Total Bilirubin: 0.8 mg/dL (ref 0.2–1.2)
Total Protein: 7.2 g/dL (ref 6.0–8.3)

## 2014-10-15 LAB — TSH: TSH: 2.39 u[IU]/mL (ref 0.35–4.50)

## 2014-10-15 LAB — LDL CHOLESTEROL, DIRECT: Direct LDL: 87 mg/dL

## 2014-10-15 LAB — MICROALBUMIN / CREATININE URINE RATIO
Creatinine,U: 79.1 mg/dL
Microalb Creat Ratio: 0.9 mg/g (ref 0.0–30.0)
Microalb, Ur: <0.7 mg/dL (ref 0.0–1.9)

## 2014-10-15 LAB — HEMOGLOBIN A1C: Hgb A1c MFr Bld: 7.8 % — ABNORMAL HIGH (ref 4.6–6.5)

## 2014-10-15 LAB — PSA: PSA: 0.7 ng/mL (ref 0.10–4.00)

## 2014-10-21 ENCOUNTER — Encounter: Payer: Self-pay | Admitting: Family Medicine

## 2014-10-21 ENCOUNTER — Ambulatory Visit (INDEPENDENT_AMBULATORY_CARE_PROVIDER_SITE_OTHER): Payer: BLUE CROSS/BLUE SHIELD | Admitting: Family Medicine

## 2014-10-21 VITALS — BP 130/80 | HR 86 | Temp 98.3°F | Ht 72.0 in | Wt 227.0 lb

## 2014-10-21 DIAGNOSIS — E119 Type 2 diabetes mellitus without complications: Secondary | ICD-10-CM

## 2014-10-21 DIAGNOSIS — Z0000 Encounter for general adult medical examination without abnormal findings: Secondary | ICD-10-CM

## 2014-10-21 DIAGNOSIS — E1165 Type 2 diabetes mellitus with hyperglycemia: Secondary | ICD-10-CM

## 2014-10-21 NOTE — Progress Notes (Signed)
Pre visit review using our clinic review tool, if applicable. No additional management support is needed unless otherwise documented below in the visit note. 

## 2014-10-21 NOTE — Progress Notes (Signed)
Subjective:    Patient ID: Dylan Hamilton, male    DOB: 1960/11/04, 54 y.o.   MRN: [id number]  HPI Patient seen for complete physical. He has history of obesity, type 2 diabetes, dyslipidemia. Poor compliance with diet and exercise. Currently on 3 drug regimen for diabetes. He did not have flu vaccine this year and declines. Tetanus up-to-date. Declines pneumonia vaccine. Nonsmoker. He's had both elevated fasting as well as postprandial blood sugars recently. He declines further medications at this time. No recent chest pains.  Past Medical History  Diagnosis Date  . Diabetes mellitus type II   . Hyperlipidemia   . Hypercholesterolemia    Past Surgical History  Procedure Laterality Date  . I&d extremity  07/17/2012    Procedure: IRRIGATION AND DEBRIDEMENT EXTREMITY;  Surgeon: Johnette AbrahamHarrill C Coley, MD;  Location: [facility] OR;  Service: Plastics;  Laterality: Right;  Repair Right thumb laceration  . Inguinal hernia repair  1991  . Wisdom tooth extraction      reports that he has never smoked. He has never used smokeless tobacco. He reports that he does not drink alcohol or use illicit drugs. family history includes Cancer in his brother, father, and other; Diabetes in his brother, mother, and other; Stroke in his paternal grandfather. There is no history of Colon cancer, Rectal cancer, or Stomach cancer. No Known Allergies    Review of Systems  Constitutional: Negative for fever, activity change, appetite change and fatigue.  HENT: Negative for congestion, ear pain and trouble swallowing.   Eyes: Negative for pain and visual disturbance.  Respiratory: Negative for cough, shortness of breath and wheezing.   Cardiovascular: Negative for chest pain and palpitations.  Gastrointestinal: Negative for nausea, vomiting, abdominal pain, diarrhea, constipation, blood in stool, abdominal distention and rectal pain.  Endocrine: Negative for polydipsia and polyuria.  Genitourinary: Negative for dysuria,  hematuria and testicular pain.  Musculoskeletal: Negative for joint swelling and arthralgias.  Skin: Negative for rash.  Neurological: Negative for dizziness, syncope and headaches.  Hematological: Negative for adenopathy.  Psychiatric/Behavioral: Negative for confusion and dysphoric mood.       Objective:   Physical Exam  Constitutional: He is oriented to person, place, and time. He appears well-developed and well-nourished. No distress.  HENT:  Head: Normocephalic and atraumatic.  Right Ear: External ear normal.  Left Ear: External ear normal.  Mouth/Throat: Oropharynx is clear and moist.  Eyes: Conjunctivae and EOM are normal. Pupils are equal, round, and reactive to light.  Neck: Normal range of motion. Neck supple. No thyromegaly present.  Cardiovascular: Normal rate, regular rhythm and normal heart sounds.   No murmur heard. Pulmonary/Chest: No respiratory distress. He has no wheezes. He has no rales.  Abdominal: Soft. Bowel sounds are normal. He exhibits no distension and no mass. There is no tenderness. There is no rebound and no guarding.  Musculoskeletal: He exhibits no edema.  Lymphadenopathy:    He has no cervical adenopathy.  Neurological: He is alert and oriented to person, place, and time. He displays normal reflexes. No cranial nerve deficit.  Skin: No rash noted.  Psychiatric: He has a normal mood and affect.          Assessment & Plan:  Complete physical. Labs reviewed. Hemoglobin A1c 7.8%. We discussed further options for improved control. He prefers lifestyle management and weight loss and recheck A1c in 4 months. We explained that long-acting low-dose insulin or GLP-1 class would be next options. He's had previous diabetic education.  recommended flu  vaccine and Pneumovax and he declines both

## 2014-10-22 ENCOUNTER — Other Ambulatory Visit: Payer: Self-pay | Admitting: Family Medicine

## 2014-10-29 ENCOUNTER — Other Ambulatory Visit: Payer: Self-pay | Admitting: Family Medicine

## 2014-11-21 ENCOUNTER — Other Ambulatory Visit: Payer: Self-pay | Admitting: Family Medicine

## 2014-12-12 ENCOUNTER — Telehealth: Payer: Self-pay | Admitting: Family Medicine

## 2014-12-12 NOTE — Telephone Encounter (Signed)
Faxed Rx to pharmacy. Pt wife is aware.

## 2014-12-12 NOTE — Telephone Encounter (Signed)
Pt states dr Caryl Neverburchette advised pt he will write rx for anxiety for traveling (flying)   [facility]

## 2014-12-12 NOTE — Telephone Encounter (Signed)
Last visit 10/21/14

## 2014-12-12 NOTE — Telephone Encounter (Signed)
I sent in script for his wife. They could use same dose. No need to send in separate script.

## 2015-02-19 ENCOUNTER — Other Ambulatory Visit (INDEPENDENT_AMBULATORY_CARE_PROVIDER_SITE_OTHER): Payer: BLUE CROSS/BLUE SHIELD

## 2015-02-19 DIAGNOSIS — E119 Type 2 diabetes mellitus without complications: Secondary | ICD-10-CM

## 2015-02-19 DIAGNOSIS — E1165 Type 2 diabetes mellitus with hyperglycemia: Secondary | ICD-10-CM

## 2015-02-19 LAB — HEMOGLOBIN A1C: Hgb A1c MFr Bld: 7 % — ABNORMAL HIGH (ref 4.6–6.5)

## 2015-05-05 LAB — HM DIABETES EYE EXAM

## 2015-05-06 ENCOUNTER — Other Ambulatory Visit: Payer: Self-pay | Admitting: Family Medicine

## 2015-05-07 ENCOUNTER — Encounter: Payer: Self-pay | Admitting: Family Medicine

## 2015-07-16 ENCOUNTER — Encounter: Payer: Self-pay | Admitting: Family Medicine

## 2015-07-16 ENCOUNTER — Ambulatory Visit (INDEPENDENT_AMBULATORY_CARE_PROVIDER_SITE_OTHER): Payer: BLUE CROSS/BLUE SHIELD | Admitting: Family Medicine

## 2015-07-16 VITALS — BP 138/86 | HR 84 | Temp 98.2°F | Resp 16 | Ht 72.0 in | Wt 231.0 lb

## 2015-07-16 DIAGNOSIS — E119 Type 2 diabetes mellitus without complications: Secondary | ICD-10-CM

## 2015-07-16 DIAGNOSIS — K429 Umbilical hernia without obstruction or gangrene: Secondary | ICD-10-CM | POA: Diagnosis not present

## 2015-07-16 DIAGNOSIS — H61892 Other specified disorders of left external ear: Secondary | ICD-10-CM | POA: Diagnosis not present

## 2015-07-16 DIAGNOSIS — IMO0001 Reserved for inherently not codable concepts without codable children: Secondary | ICD-10-CM

## 2015-07-16 DIAGNOSIS — R030 Elevated blood-pressure reading, without diagnosis of hypertension: Secondary | ICD-10-CM | POA: Diagnosis not present

## 2015-07-16 NOTE — Progress Notes (Signed)
Pre visit review using our clinic review tool, if applicable. No additional management support is needed unless otherwise documented below in the visit note. 

## 2015-07-16 NOTE — Progress Notes (Signed)
   Subjective:    Patient ID: Dylan Hamilton, male    DOB: 28-Jan-1961, 54 y.o.   MRN: [id number]  HPI Patient here for the following items  Left ear nodule. Noted for several months and has become more painful recently. He denies any prior history of skin cancer but has had prior history of actinic keratoses. Slowly growing. Never biopsied  Umbilical hernia. Slowly enlarging. Not painful. Recently has developed slightly purplish color. Always soft to palpation  Type 2 diabetes. Last A1c 7.0%. Currently treated with metformin, Amaryl, and invokana. Does not monitor blood sugars. No polyuria or polydipsia. He's had a few pounds weight gain since last visit.  No history of hypertension but slightly elevated reading here initially today. No headaches. No chest pains.  Past Medical History  Diagnosis Date  . Diabetes mellitus type II   . Hyperlipidemia   . Hypercholesterolemia    Past Surgical History  Procedure Laterality Date  . I&d extremity  07/17/2012    Procedure: IRRIGATION AND DEBRIDEMENT EXTREMITY;  Surgeon: Johnette AbrahamHarrill C Coley, MD;  Location: [facility] OR;  Service: Plastics;  Laterality: Right;  Repair Right thumb laceration  . Inguinal hernia repair  1991  . Wisdom tooth extraction      reports that he has never smoked. He has never used smokeless tobacco. He reports that he does not drink alcohol or use illicit drugs. family history includes Cancer in his brother, father, and other; Diabetes in his brother, mother, and other; Stroke in his paternal grandfather. There is no history of Colon cancer, Rectal cancer, or Stomach cancer. No Known Allergies    Review of Systems  Constitutional: Negative for fatigue and unexpected weight change.  Eyes: Negative for visual disturbance.  Respiratory: Negative for cough, chest tightness and shortness of breath.   Cardiovascular: Negative for chest pain, palpitations and leg swelling.  Endocrine: Negative for polydipsia and polyuria.    Neurological: Negative for dizziness, syncope, weakness, light-headedness and headaches.       Objective:   Physical Exam  Constitutional: He appears well-developed and well-nourished.  Neck: Neck supple. No thyromegaly present.  Cardiovascular: Normal rate and regular rhythm.   Pulmonary/Chest: Effort normal and breath sounds normal. No respiratory distress. He has no wheezes. He has no rales.  Abdominal: Soft. There is no tenderness. There is no rebound and no guarding.  Umbilical hernia which is soft and nontender.  Musculoskeletal: He exhibits no edema.  Lymphadenopathy:    He has no cervical adenopathy.  Skin:  Patient has small nodule about 7 mm diameter left external ear. He has slightly scabbed ulcerative surface. No abnormal pigmentary change          Assessment & Plan:  #1 left external ear nodule. Referral to [facility] surgery Center. Rule out early squamous cell cancer  #2 umbilical hernia. Currently asymptomatic. Slowly growing in size. Reviewed signs and symptoms of strangulation. Patient not interested in surgical referral this time. Readdress at physical in February  #3 type 2 diabetes.   History of fair control. Strongly advocate weight loss. Cost may become an issue with invokana.  Repeat A1c with physical in 3 months  #4 elevated blood pressure. Initial blood pressure today 160/90 and repeat after rest left arm seated 138/86. Discussed nonpharmacologic ways to help control blood pressure. Strongly advocate weight loss. If not further improved at physical consider initiate ACE or ARB.

## 2015-07-16 NOTE — Patient Instructions (Signed)
Monitor blood pressure and be in touch if consistently > 140/90.   

## 2015-08-11 ENCOUNTER — Other Ambulatory Visit: Payer: Self-pay | Admitting: Family Medicine

## 2015-08-17 ENCOUNTER — Other Ambulatory Visit: Payer: Self-pay | Admitting: Family Medicine

## 2015-08-26 ENCOUNTER — Other Ambulatory Visit: Payer: Self-pay | Admitting: Family Medicine

## 2015-10-16 ENCOUNTER — Other Ambulatory Visit (INDEPENDENT_AMBULATORY_CARE_PROVIDER_SITE_OTHER): Payer: BLUE CROSS/BLUE SHIELD

## 2015-10-16 DIAGNOSIS — R7989 Other specified abnormal findings of blood chemistry: Secondary | ICD-10-CM | POA: Diagnosis not present

## 2015-10-16 DIAGNOSIS — Z0000 Encounter for general adult medical examination without abnormal findings: Secondary | ICD-10-CM | POA: Diagnosis not present

## 2015-10-16 LAB — CBC WITH DIFFERENTIAL/PLATELET
Basophils Absolute: 0 10*3/uL (ref 0.0–0.1)
Basophils Relative: 0.5 % (ref 0.0–3.0)
Eosinophils Absolute: 0.3 10*3/uL (ref 0.0–0.7)
Eosinophils Relative: 3.8 % (ref 0.0–5.0)
HCT: 45 % (ref 39.0–52.0)
Hemoglobin: 15.6 g/dL (ref 13.0–17.0)
Lymphocytes Relative: 27.6 % (ref 12.0–46.0)
Lymphs Abs: 2.4 10*3/uL (ref 0.7–4.0)
MCHC: 34.5 g/dL (ref 30.0–36.0)
MCV: 90.8 fl (ref 78.0–100.0)
Monocytes Absolute: 1.1 10*3/uL — ABNORMAL HIGH (ref 0.1–1.0)
Monocytes Relative: 12.7 % — ABNORMAL HIGH (ref 3.0–12.0)
Neutro Abs: 4.9 10*3/uL (ref 1.4–7.7)
Neutrophils Relative %: 55.4 % (ref 43.0–77.0)
Platelets: 306 10*3/uL (ref 150.0–400.0)
RBC: 4.96 Mil/uL (ref 4.22–5.81)
RDW: 13 % (ref 11.5–15.5)
WBC: 8.9 10*3/uL (ref 4.0–10.5)

## 2015-10-16 LAB — BASIC METABOLIC PANEL
BUN: 21 mg/dL (ref 6–23)
CO2: 27 mEq/L (ref 19–32)
Calcium: 9.4 mg/dL (ref 8.4–10.5)
Chloride: 99 mEq/L (ref 96–112)
Creatinine, Ser: 0.96 mg/dL (ref 0.40–1.50)
GFR: 85.98 mL/min (ref >60.00)
Glucose, Bld: 149 mg/dL — ABNORMAL HIGH (ref 70–99)
Potassium: 4.7 mEq/L (ref 3.5–5.1)
Sodium: 135 mEq/L (ref 135–145)

## 2015-10-16 LAB — MICROALBUMIN / CREATININE URINE RATIO
Creatinine,U: 73.6 mg/dL
Microalb Creat Ratio: 1 mg/g (ref 0.0–30.0)
Microalb, Ur: <0.7 mg/dL (ref 0.0–1.9)

## 2015-10-16 LAB — HEPATIC FUNCTION PANEL
ALT: 30 U/L (ref 0–53)
AST: 25 U/L (ref 0–37)
Albumin: 4.7 g/dL (ref 3.5–5.2)
Alkaline Phosphatase: 48 U/L (ref 39–117)
Bilirubin, Direct: 0.1 mg/dL (ref 0.0–0.3)
Total Bilirubin: 0.6 mg/dL (ref 0.2–1.2)
Total Protein: 7.9 g/dL (ref 6.0–8.3)

## 2015-10-16 LAB — HEMOGLOBIN A1C: Hgb A1c MFr Bld: 7.9 % — ABNORMAL HIGH (ref 4.6–6.5)

## 2015-10-16 LAB — PSA: PSA: 0.81 ng/mL (ref 0.10–4.00)

## 2015-10-16 LAB — LIPID PANEL
Cholesterol: 159 mg/dL (ref 0–200)
HDL: 37.2 mg/dL — ABNORMAL LOW (ref >39.00)
Total CHOL/HDL Ratio: 4
Triglycerides: 459 mg/dL — ABNORMAL HIGH (ref 0.0–149.0)

## 2015-10-16 LAB — LDL CHOLESTEROL, DIRECT: Direct LDL: 62 mg/dL

## 2015-10-16 LAB — TSH: TSH: 1.99 u[IU]/mL (ref 0.35–4.50)

## 2015-10-23 ENCOUNTER — Ambulatory Visit (INDEPENDENT_AMBULATORY_CARE_PROVIDER_SITE_OTHER): Payer: BLUE CROSS/BLUE SHIELD | Admitting: Family Medicine

## 2015-10-23 ENCOUNTER — Encounter: Payer: Self-pay | Admitting: Family Medicine

## 2015-10-23 VITALS — BP 138/90 | HR 71 | Temp 97.9°F | Ht 71.5 in | Wt 227.0 lb

## 2015-10-23 DIAGNOSIS — E119 Type 2 diabetes mellitus without complications: Secondary | ICD-10-CM

## 2015-10-23 DIAGNOSIS — Z0000 Encounter for general adult medical examination without abnormal findings: Secondary | ICD-10-CM | POA: Diagnosis not present

## 2015-10-23 LAB — HM DIABETES FOOT EXAM: HM Diabetic Foot Exam: NORMAL

## 2015-10-23 NOTE — Patient Instructions (Signed)
Let me know when you want to see surgeon regarding umbilical hernia.

## 2015-10-23 NOTE — Progress Notes (Signed)
Pre visit review using our clinic review tool, if applicable. No additional management support is needed unless otherwise documented below in the visit note. 

## 2015-10-23 NOTE — Progress Notes (Signed)
Subjective:    Patient ID: Dylan Hamilton, male    DOB: 07-10-1961, 55 y.o.   MRN: [id number]  HPI  patient here for physical  Chronic  Problems include obesity, type 2 diabetes , actinic keratoses, dyslipidemia  Colonoscopy up-to-date.   Patient declines Pneumovax and flu vaccine. Eye exam up-to-date  he is just recently started walking some for exercise.   Umbilical hernia which is non-tender. He thinks he might want to get this repaired later this year. Slowly increasing in size.   Blood sugars relatively stable. Fasting blood sugars usually around 150 but later in the day usually 85-100 range. No polyuria or polydipsia.  Past Medical History  Diagnosis Date  . Diabetes mellitus type II   . Hyperlipidemia   . Hypercholesterolemia    Past Surgical History  Procedure Laterality Date  . I&d extremity  07/17/2012    Procedure: IRRIGATION AND DEBRIDEMENT EXTREMITY;  Surgeon: Johnette Abraham, MD;  Location: [facility] OR;  Service: Plastics;  Laterality: Right;  Repair Right thumb laceration  . Inguinal hernia repair  1991  . Wisdom tooth extraction      reports that he has never smoked. He has never used smokeless tobacco. He reports that he does not drink alcohol or use illicit drugs. family history includes Cancer in his brother, father, and other; Diabetes in his brother, mother, and other; Stroke in his paternal grandfather. There is no history of Colon cancer, Rectal cancer, or Stomach cancer. No Known Allergies    Review of Systems  Constitutional: Negative for fever, activity change, appetite change and fatigue.  HENT: Negative for congestion, ear pain and trouble swallowing.   Eyes: Negative for pain and visual disturbance.  Respiratory: Negative for cough, shortness of breath and wheezing.   Cardiovascular: Negative for chest pain and palpitations.  Gastrointestinal: Negative for nausea, vomiting, abdominal pain, diarrhea, constipation, blood in stool, abdominal distention and  rectal pain.  Genitourinary: Negative for dysuria, hematuria and testicular pain.  Musculoskeletal: Negative for joint swelling and arthralgias.  Skin: Negative for rash.  Neurological: Negative for dizziness, syncope and headaches.  Hematological: Negative for adenopathy.  Psychiatric/Behavioral: Negative for confusion and dysphoric mood.       Objective:   Physical Exam  Constitutional: He is oriented to person, place, and time. He appears well-developed and well-nourished. No distress.  HENT:  Head: Normocephalic and atraumatic.  Right Ear: External ear normal.  Left Ear: External ear normal.  Mouth/Throat: Oropharynx is clear and moist.  Eyes: Conjunctivae and EOM are normal. Pupils are equal, round, and reactive to light.  Neck: Normal range of motion. Neck supple. No thyromegaly present.  Cardiovascular: Normal rate, regular rhythm and normal heart sounds.   No murmur heard. Pulmonary/Chest: No respiratory distress. He has no wheezes. He has no rales.  Abdominal: Soft. Bowel sounds are normal. He exhibits no distension and no mass. There is no tenderness. There is no rebound and no guarding.  Musculoskeletal: He exhibits no edema.  Lymphadenopathy:    He has no cervical adenopathy.  Neurological: He is alert and oriented to person, place, and time. He displays normal reflexes. No cranial nerve deficit.  Skin: No rash noted.  Feet reveal no skin lesions. Good distal foot pulses. Good capillary refill. No calluses. Normal sensation with monofilament testing   Psychiatric: He has a normal mood and affect.          Assessment & Plan:   physical exam. We recommended Pneumovax and flu vaccine he declines  both. Labs reviewed. A1c 7.9%. Reviewed options of adding GLP-1 medication versus weight loss and recheck A1c in 4 months and he prefers the latter. He will let me know when interested in getting umbilical hernia repair.

## 2015-10-24 ENCOUNTER — Telehealth: Payer: Self-pay | Admitting: Family Medicine

## 2015-10-24 NOTE — Telephone Encounter (Signed)
Letter faxed to employer

## 2015-10-24 NOTE — Telephone Encounter (Signed)
Patient stated that he saw [facility]chet[employer] yesterday, and he need a letter for his job stating he was here. Please advise fax number [phone]

## 2016-02-26 ENCOUNTER — Other Ambulatory Visit (INDEPENDENT_AMBULATORY_CARE_PROVIDER_SITE_OTHER): Payer: BLUE CROSS/BLUE SHIELD

## 2016-02-26 DIAGNOSIS — E119 Type 2 diabetes mellitus without complications: Secondary | ICD-10-CM

## 2016-02-26 LAB — HEMOGLOBIN A1C: Hgb A1c MFr Bld: 7.8 % — ABNORMAL HIGH (ref 4.6–6.5)

## 2016-02-27 MED ORDER — DULAGLUTIDE 0.75 MG/0.5ML ~~LOC~~ SOAJ: SUBCUTANEOUS | Status: DC

## 2016-03-11 DIAGNOSIS — L814 Other melanin hyperpigmentation: Secondary | ICD-10-CM | POA: Diagnosis not present

## 2016-03-11 DIAGNOSIS — L821 Other seborrheic keratosis: Secondary | ICD-10-CM | POA: Diagnosis not present

## 2016-03-11 DIAGNOSIS — L570 Actinic keratosis: Secondary | ICD-10-CM | POA: Diagnosis not present

## 2016-03-11 DIAGNOSIS — D1801 Hemangioma of skin and subcutaneous tissue: Secondary | ICD-10-CM | POA: Diagnosis not present

## 2016-04-30 ENCOUNTER — Encounter: Payer: Self-pay | Admitting: Family Medicine

## 2016-04-30 ENCOUNTER — Ambulatory Visit (INDEPENDENT_AMBULATORY_CARE_PROVIDER_SITE_OTHER): Payer: BLUE CROSS/BLUE SHIELD | Admitting: Family Medicine

## 2016-04-30 VITALS — BP 134/80 | HR 81 | Temp 98.1°F | Ht 71.5 in | Wt 233.0 lb

## 2016-04-30 DIAGNOSIS — R0789 Other chest pain: Secondary | ICD-10-CM

## 2016-04-30 MED ORDER — HYDROCODONE-ACETAMINOPHEN 5-325 MG PO TABS: 1.0000 | ORAL_TABLET | Freq: Four times a day (QID) | ORAL | 0 refills | Status: DC | PRN

## 2016-04-30 NOTE — Progress Notes (Signed)
Pre visit review using our clinic review tool, if applicable. No additional management support is needed unless otherwise documented below in the visit note. 

## 2016-04-30 NOTE — Patient Instructions (Signed)
Touch base by next week if no better. May try heat for symptom relief.

## 2016-04-30 NOTE — Progress Notes (Signed)
Subjective:     Patient ID: Dylan Hamilton, male   DOB: 1960-10-13, 55 y.o.   MRN: [id number]  HPI Patient seen with right lower anterior rib cage pain. He states 8 days ago he went to sneeze and felt a "pop "right lower rib cage region. Since that time his had some pain in his region especially with cough or sneeze. He has some radiation toward the back. Consistently worse with movement such as rolling over in bed. No pleuritic pain. No hemoptysis. No fevers or chills. No dyspnea. Pain 6/10 at its worst. Has taken Advil and naproxen without much improvement. No skin rash.  Past Medical History:  Diagnosis Date  . Diabetes mellitus type II   . Hypercholesterolemia   . Hyperlipidemia    Past Surgical History:  Procedure Laterality Date  . I&D EXTREMITY  07/17/2012   Procedure: IRRIGATION AND DEBRIDEMENT EXTREMITY;  Surgeon: Johnette AbrahamHarrill C Coley, MD;  Location: [facility] OR;  Service: Plastics;  Laterality: Right;  Repair Right thumb laceration  . INGUINAL HERNIA REPAIR  1991  . WISDOM TOOTH EXTRACTION      reports that he has never smoked. He has never used smokeless tobacco. He reports that he does not drink alcohol or use drugs. family history includes Cancer in his brother, father, and other; Diabetes in his brother, mother, and other; Stroke in his paternal grandfather. No Known Allergies   Review of Systems  Constitutional: Negative for chills and fever.  Respiratory: Negative for cough and shortness of breath.   Cardiovascular: Negative for chest pain.  Gastrointestinal: Negative for abdominal pain.  Skin: Negative for rash.       Objective:   Physical Exam  Constitutional: He appears well-developed and well-nourished.  Cardiovascular: Normal rate and regular rhythm.   No murmur heard. Pulmonary/Chest: Effort normal and breath sounds normal. No respiratory distress. He has no wheezes. He has no rales.  Tender over the right lower anterior rib cage region.  Abdominal: Soft. There is no  tenderness.  Skin: No rash noted.       Assessment:     Right anterior rib pain following sneeze as above. Suspect musculoskeletal.    Plan:     -Limited Vicodin 5 mg 1-2 every 6 hours as noted for severe pain -May supplement with Advil as needed -Try heat for symptomatic relief -Consider x-rays of right anterior ribs by next week if not improving  Kristian CoveyBruce W Dionisia Pacholski MD [facility]

## 2016-05-13 ENCOUNTER — Other Ambulatory Visit: Payer: Self-pay | Admitting: Family Medicine

## 2016-05-13 DIAGNOSIS — H25013 Cortical age-related cataract, bilateral: Secondary | ICD-10-CM | POA: Diagnosis not present

## 2016-05-13 DIAGNOSIS — H2513 Age-related nuclear cataract, bilateral: Secondary | ICD-10-CM | POA: Diagnosis not present

## 2016-05-13 DIAGNOSIS — E119 Type 2 diabetes mellitus without complications: Secondary | ICD-10-CM | POA: Diagnosis not present

## 2016-05-13 DIAGNOSIS — H40013 Open angle with borderline findings, low risk, bilateral: Secondary | ICD-10-CM | POA: Diagnosis not present

## 2016-05-13 LAB — HM DIABETES EYE EXAM

## 2016-05-14 ENCOUNTER — Encounter: Payer: Self-pay | Admitting: Family Medicine

## 2016-06-02 ENCOUNTER — Other Ambulatory Visit: Payer: Self-pay | Admitting: Family Medicine

## 2016-06-03 ENCOUNTER — Other Ambulatory Visit (INDEPENDENT_AMBULATORY_CARE_PROVIDER_SITE_OTHER): Payer: BLUE CROSS/BLUE SHIELD

## 2016-06-03 DIAGNOSIS — E119 Type 2 diabetes mellitus without complications: Secondary | ICD-10-CM

## 2016-06-03 LAB — HEMOGLOBIN A1C: Hgb A1c MFr Bld: 7.2 % — ABNORMAL HIGH (ref 4.6–6.5)

## 2016-06-12 ENCOUNTER — Other Ambulatory Visit: Payer: Self-pay | Admitting: Family Medicine

## 2016-07-02 ENCOUNTER — Other Ambulatory Visit: Payer: Self-pay | Admitting: Family Medicine

## 2016-08-02 ENCOUNTER — Other Ambulatory Visit: Payer: Self-pay | Admitting: Family Medicine

## 2016-09-07 ENCOUNTER — Ambulatory Visit (INDEPENDENT_AMBULATORY_CARE_PROVIDER_SITE_OTHER): Payer: BLUE CROSS/BLUE SHIELD | Admitting: Family Medicine

## 2016-09-07 ENCOUNTER — Encounter: Payer: Self-pay | Admitting: Family Medicine

## 2016-09-07 VITALS — BP 140/80 | HR 105 | Temp 101.0°F | Ht 71.5 in | Wt 232.0 lb

## 2016-09-07 DIAGNOSIS — R319 Hematuria, unspecified: Secondary | ICD-10-CM | POA: Diagnosis not present

## 2016-09-07 DIAGNOSIS — N390 Urinary tract infection, site not specified: Secondary | ICD-10-CM | POA: Diagnosis not present

## 2016-09-07 DIAGNOSIS — R42 Dizziness and giddiness: Secondary | ICD-10-CM

## 2016-09-07 DIAGNOSIS — R300 Dysuria: Secondary | ICD-10-CM | POA: Diagnosis not present

## 2016-09-07 DIAGNOSIS — E119 Type 2 diabetes mellitus without complications: Secondary | ICD-10-CM | POA: Diagnosis not present

## 2016-09-07 LAB — POCT URINALYSIS DIPSTICK
Bilirubin, UA: NEGATIVE
Nitrite, UA: POSITIVE
Spec Grav, UA: 1.015
Urobilinogen, UA: 1
pH, UA: 8.5

## 2016-09-07 LAB — POCT GLUCOSE (DEVICE FOR HOME USE): Glucose Fasting, POC: 119 mg/dL — AB (ref 70–99)

## 2016-09-07 MED ORDER — CEFTRIAXONE SODIUM 1 G IJ SOLR
1.0000 g | Freq: Once | INTRAMUSCULAR | Status: AC
Start: 2016-09-07 — End: 2016-09-07
  Administered 2016-09-07: 1 g via INTRAMUSCULAR

## 2016-09-07 MED ORDER — CIPROFLOXACIN HCL 500 MG PO TABS: 500.0000 mg | ORAL_TABLET | Freq: Two times a day (BID) | ORAL | 0 refills | Status: DC

## 2016-09-07 NOTE — Patient Instructions (Signed)
Drink plenty of fluids Take the Cipro one by mouth twice daily Monitor blood sugars closely Blood sugars frequently increase with infection Follow up promptly for any vomiting, confusion, or if fever not resolving over the next 24 hours. Get plenty of rest. We will call with culture results.

## 2016-09-07 NOTE — Progress Notes (Signed)
Pre visit review using our clinic review tool, if applicable. No additional management support is needed unless otherwise documented below in the visit note. 

## 2016-09-07 NOTE — Progress Notes (Signed)
Subjective:     Patient ID: Dylan Hamilton, male   DOB: 03-11-1961, 56 y.o.   MRN: [id number]  HPI Patient is seen with about 3 day history of urine frequency and burning with urination. He noted on Saturday had increased fatigue and some myalgias. He's had no respiratory symptoms whatsoever- such as cough or nasal congestion. No nausea or vomiting. He's had some vague lower back pain bilaterally in the lower lumbar area. No history of UTI. Did not go to work yesterday or today because of symptoms. No history of prostatitis. No penile discharge. He has type 2 diabetes with history of poor control. Not monitoring blood sugars regularly. Drove here but feels very fatigued. Did not take his temperature earlier but has had chills intermittently for the past couple of days.  Past Medical History:  Diagnosis Date  . Diabetes mellitus type II   . Hypercholesterolemia   . Hyperlipidemia    Past Surgical History:  Procedure Laterality Date  . I&D EXTREMITY  07/17/2012   Procedure: IRRIGATION AND DEBRIDEMENT EXTREMITY;  Surgeon: Johnette AbrahamHarrill C Coley, MD;  Location: [facility] OR;  Service: Plastics;  Laterality: Right;  Repair Right thumb laceration  . INGUINAL HERNIA REPAIR  1991  . WISDOM TOOTH EXTRACTION      reports that he has never smoked. He has never used smokeless tobacco. He reports that he does not drink alcohol or use drugs. family history includes Cancer in his brother, father, and other; Diabetes in his brother, mother, and other; Stroke in his paternal grandfather. No Known Allergies   Review of Systems  Constitutional: Positive for chills, fatigue and fever.  Respiratory: Negative for cough and shortness of breath.   Cardiovascular: Negative for chest pain.  Gastrointestinal: Negative for abdominal pain, nausea and vomiting.  Genitourinary: Positive for dysuria and frequency. Negative for flank pain and hematuria.       Objective:   Physical Exam  Constitutional: He appears well-developed and  well-nourished.  HENT:  Mouth/Throat: Oropharynx is clear and moist.  Neck: Neck supple.  Cardiovascular:  Mild tachycardia but regular rhythm  Pulmonary/Chest: Effort normal and breath sounds normal. No respiratory distress. He has no wheezes. He has no rales.  Genitourinary:  Genitourinary Comments: Prostate is not enlarged and nontender palpation  Musculoskeletal: He exhibits no edema.  Skin: No rash noted.       Assessment:     Patient presents with a few day history of urine frequency, dysuria, fever. No evidence to suggest prostatitis by exam. He does have some fever but vital signs otherwise are stable. Standing blood pressure 128/70    Plan:     -We elected to give Rocephin 1 g IM and patient observed for 30 minutes with no difficulties -Urine culture sent -Start Cipro 500 milligrams twice a day for 7 days -Drink plenty fluids.  Patient was able to keep down 3 cups of water here -Follow-up immediately for any nausea, vomiting, confusion, or other concerns  Kristian CoveyBruce W Deshawn Witty MD [facility]

## 2016-09-09 LAB — URINE CULTURE: Colony Count: >=100000

## 2016-09-29 ENCOUNTER — Other Ambulatory Visit: Payer: Self-pay | Admitting: Family Medicine

## 2016-09-29 ENCOUNTER — Encounter: Payer: Self-pay | Admitting: Family Medicine

## 2016-10-18 ENCOUNTER — Other Ambulatory Visit: Payer: Self-pay

## 2016-10-18 MED ORDER — GLUCOSE BLOOD VI STRP: ORAL_STRIP | 3 refills | Status: DC

## 2016-10-18 MED ORDER — BAYER MICROLET LANCETS MISC: 3 refills | Status: AC

## 2016-10-18 MED ORDER — BAYER CONTOUR MONITOR DEVI: 0 refills | Status: DC

## 2016-10-19 ENCOUNTER — Encounter: Payer: Self-pay | Admitting: Family Medicine

## 2016-10-19 ENCOUNTER — Other Ambulatory Visit (INDEPENDENT_AMBULATORY_CARE_PROVIDER_SITE_OTHER): Payer: BLUE CROSS/BLUE SHIELD

## 2016-10-19 DIAGNOSIS — R7989 Other specified abnormal findings of blood chemistry: Secondary | ICD-10-CM | POA: Diagnosis not present

## 2016-10-19 DIAGNOSIS — Z0000 Encounter for general adult medical examination without abnormal findings: Secondary | ICD-10-CM | POA: Diagnosis not present

## 2016-10-19 LAB — CBC WITH DIFFERENTIAL/PLATELET
Basophils Absolute: 0.1 10*3/uL (ref 0.0–0.1)
Basophils Relative: 1.1 % (ref 0.0–3.0)
Eosinophils Absolute: 0.3 10*3/uL (ref 0.0–0.7)
Eosinophils Relative: 4 % (ref 0.0–5.0)
HCT: 43.1 % (ref 39.0–52.0)
Hemoglobin: 14.6 g/dL (ref 13.0–17.0)
Lymphocytes Relative: 26.1 % (ref 12.0–46.0)
Lymphs Abs: 2 10*3/uL (ref 0.7–4.0)
MCHC: 33.9 g/dL (ref 30.0–36.0)
MCV: 91.3 fl (ref 78.0–100.0)
Monocytes Absolute: 0.8 10*3/uL (ref 0.1–1.0)
Monocytes Relative: 10.6 % (ref 3.0–12.0)
Neutro Abs: 4.4 10*3/uL (ref 1.4–7.7)
Neutrophils Relative %: 58.2 % (ref 43.0–77.0)
Platelets: 281 10*3/uL (ref 150.0–400.0)
RBC: 4.72 Mil/uL (ref 4.22–5.81)
RDW: 13.8 % (ref 11.5–15.5)
WBC: 7.5 10*3/uL (ref 4.0–10.5)

## 2016-10-19 LAB — BASIC METABOLIC PANEL
BUN: 18 mg/dL (ref 6–23)
CO2: 27 mEq/L (ref 19–32)
Calcium: 9.4 mg/dL (ref 8.4–10.5)
Chloride: 103 mEq/L (ref 96–112)
Creatinine, Ser: 0.81 mg/dL (ref 0.40–1.50)
GFR: 104.22 mL/min (ref >60.00)
Glucose, Bld: 89 mg/dL (ref 70–99)
Potassium: 4.4 mEq/L (ref 3.5–5.1)
Sodium: 138 mEq/L (ref 135–145)

## 2016-10-19 LAB — HEPATIC FUNCTION PANEL
ALT: 28 U/L (ref 0–53)
AST: 21 U/L (ref 0–37)
Albumin: 4.3 g/dL (ref 3.5–5.2)
Alkaline Phosphatase: 50 U/L (ref 39–117)
Bilirubin, Direct: 0.1 mg/dL (ref 0.0–0.3)
Total Bilirubin: 0.5 mg/dL (ref 0.2–1.2)
Total Protein: 7.1 g/dL (ref 6.0–8.3)

## 2016-10-19 LAB — MICROALBUMIN / CREATININE URINE RATIO
Creatinine,U: 67.6 mg/dL
Microalb Creat Ratio: 1 mg/g (ref 0.0–30.0)
Microalb, Ur: <0.7 mg/dL (ref 0.0–1.9)

## 2016-10-19 LAB — LDL CHOLESTEROL, DIRECT: Direct LDL: 50 mg/dL

## 2016-10-19 LAB — LIPID PANEL
Cholesterol: 132 mg/dL (ref 0–200)
HDL: 31.9 mg/dL — ABNORMAL LOW (ref >39.00)
NonHDL: 99.65
Total CHOL/HDL Ratio: 4
Triglycerides: 278 mg/dL — ABNORMAL HIGH (ref 0.0–149.0)
VLDL: 55.6 mg/dL — ABNORMAL HIGH (ref 0.0–40.0)

## 2016-10-19 LAB — TSH: TSH: 1.98 u[IU]/mL (ref 0.35–4.50)

## 2016-10-19 LAB — HEMOGLOBIN A1C: Hgb A1c MFr Bld: 6.4 % (ref 4.6–6.5)

## 2016-10-19 LAB — PSA: PSA: 0.82 ng/mL (ref 0.10–4.00)

## 2016-10-25 ENCOUNTER — Other Ambulatory Visit: Payer: Self-pay | Admitting: Family Medicine

## 2016-10-26 ENCOUNTER — Encounter: Payer: Self-pay | Admitting: Family Medicine

## 2016-10-26 ENCOUNTER — Ambulatory Visit (INDEPENDENT_AMBULATORY_CARE_PROVIDER_SITE_OTHER): Payer: BLUE CROSS/BLUE SHIELD | Admitting: Family Medicine

## 2016-10-26 VITALS — BP 118/78 | HR 75 | Temp 98.3°F | Ht 73.0 in | Wt 215.0 lb

## 2016-10-26 DIAGNOSIS — Z0000 Encounter for general adult medical examination without abnormal findings: Secondary | ICD-10-CM | POA: Diagnosis not present

## 2016-10-26 NOTE — Progress Notes (Signed)
Pre visit review using our clinic review tool, if applicable. No additional management support is needed unless otherwise documented below in the visit note. 

## 2016-10-26 NOTE — Patient Instructions (Signed)
Continue weight loss efforts. Let's plan follow up in 4 months and hopefully can scale back meds further then.
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
## 2017-01-07 ENCOUNTER — Other Ambulatory Visit: Payer: Self-pay | Admitting: Family Medicine

## 2017-01-27 ENCOUNTER — Other Ambulatory Visit: Payer: Self-pay | Admitting: Family Medicine

## 2017-02-02 ENCOUNTER — Other Ambulatory Visit: Payer: Self-pay | Admitting: Family Medicine

## 2017-02-25 ENCOUNTER — Ambulatory Visit: Payer: Self-pay | Admitting: Family Medicine

## 2017-03-01 ENCOUNTER — Encounter: Payer: Self-pay | Admitting: Family Medicine

## 2017-03-01 ENCOUNTER — Ambulatory Visit (INDEPENDENT_AMBULATORY_CARE_PROVIDER_SITE_OTHER): Payer: BLUE CROSS/BLUE SHIELD | Admitting: Family Medicine

## 2017-03-01 DIAGNOSIS — E119 Type 2 diabetes mellitus without complications: Secondary | ICD-10-CM

## 2017-03-01 LAB — POCT GLYCOSYLATED HEMOGLOBIN (HGB A1C): Hemoglobin A1C: 6.2

## 2017-03-01 MED ORDER — MOMETASONE FUROATE 0.1 % EX SOLN: Freq: Every day | CUTANEOUS | 1 refills | Status: AC
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
## 2017-03-01 NOTE — Patient Instructions (Signed)

## 2017-03-15 DIAGNOSIS — L821 Other seborrheic keratosis: Secondary | ICD-10-CM | POA: Diagnosis not present

## 2017-03-15 DIAGNOSIS — L550 Sunburn of first degree: Secondary | ICD-10-CM | POA: Diagnosis not present

## 2017-03-15 DIAGNOSIS — L570 Actinic keratosis: Secondary | ICD-10-CM | POA: Diagnosis not present

## 2017-03-15 DIAGNOSIS — L814 Other melanin hyperpigmentation: Secondary | ICD-10-CM | POA: Diagnosis not present

## 2017-03-15 DIAGNOSIS — D1801 Hemangioma of skin and subcutaneous tissue: Secondary | ICD-10-CM | POA: Diagnosis not present

## 2017-05-12 ENCOUNTER — Encounter: Payer: Self-pay | Admitting: Family Medicine

## 2017-05-24 DIAGNOSIS — H40012 Open angle with borderline findings, low risk, left eye: Secondary | ICD-10-CM | POA: Diagnosis not present

## 2017-05-24 DIAGNOSIS — H25013 Cortical age-related cataract, bilateral: Secondary | ICD-10-CM | POA: Diagnosis not present

## 2017-05-24 DIAGNOSIS — H40011 Open angle with borderline findings, low risk, right eye: Secondary | ICD-10-CM | POA: Diagnosis not present

## 2017-05-24 DIAGNOSIS — H02055 Trichiasis without entropian left lower eyelid: Secondary | ICD-10-CM | POA: Diagnosis not present

## 2017-05-24 DIAGNOSIS — H40013 Open angle with borderline findings, low risk, bilateral: Secondary | ICD-10-CM | POA: Diagnosis not present

## 2017-05-24 DIAGNOSIS — E119 Type 2 diabetes mellitus without complications: Secondary | ICD-10-CM | POA: Diagnosis not present

## 2017-05-24 LAB — HM DIABETES EYE EXAM

## 2017-06-21 ENCOUNTER — Encounter: Payer: Self-pay | Admitting: Family Medicine

## 2017-06-23 ENCOUNTER — Other Ambulatory Visit: Payer: Self-pay | Admitting: Family Medicine

## 2017-07-21 ENCOUNTER — Other Ambulatory Visit: Payer: Self-pay | Admitting: Family Medicine

## 2017-10-21 ENCOUNTER — Other Ambulatory Visit: Payer: Self-pay

## 2017-10-28 ENCOUNTER — Ambulatory Visit (INDEPENDENT_AMBULATORY_CARE_PROVIDER_SITE_OTHER): Payer: BLUE CROSS/BLUE SHIELD | Admitting: Family Medicine

## 2017-10-28 ENCOUNTER — Encounter: Payer: Self-pay | Admitting: Family Medicine

## 2017-10-28 VITALS — BP 130/80 | HR 73 | Temp 98.0°F | Ht 73.0 in | Wt 221.4 lb

## 2017-10-28 DIAGNOSIS — Z0000 Encounter for general adult medical examination without abnormal findings: Secondary | ICD-10-CM | POA: Diagnosis not present

## 2017-10-28 LAB — CBC WITH DIFFERENTIAL/PLATELET
Basophils Absolute: 0 10*3/uL (ref 0.0–0.1)
Basophils Relative: 0.6 % (ref 0.0–3.0)
Eosinophils Absolute: 0.2 10*3/uL (ref 0.0–0.7)
Eosinophils Relative: 3.1 % (ref 0.0–5.0)
HCT: 45.9 % (ref 39.0–52.0)
Hemoglobin: 15.9 g/dL (ref 13.0–17.0)
Lymphocytes Relative: 29.8 % (ref 12.0–46.0)
Lymphs Abs: 2.2 10*3/uL (ref 0.7–4.0)
MCHC: 34.7 g/dL (ref 30.0–36.0)
MCV: 92 fl (ref 78.0–100.0)
Monocytes Absolute: 0.9 10*3/uL (ref 0.1–1.0)
Monocytes Relative: 11.6 % (ref 3.0–12.0)
Neutro Abs: 4.1 10*3/uL (ref 1.4–7.7)
Neutrophils Relative %: 54.9 % (ref 43.0–77.0)
Platelets: 264 10*3/uL (ref 150.0–400.0)
RBC: 4.99 Mil/uL (ref 4.22–5.81)
RDW: 13 % (ref 11.5–15.5)
WBC: 7.4 10*3/uL (ref 4.0–10.5)

## 2017-10-28 LAB — HEPATIC FUNCTION PANEL
ALT: 30 U/L (ref 0–53)
AST: 21 U/L (ref 0–37)
Albumin: 4.6 g/dL (ref 3.5–5.2)
Alkaline Phosphatase: 44 U/L (ref 39–117)
Bilirubin, Direct: 0.1 mg/dL (ref 0.0–0.3)
Total Bilirubin: 0.5 mg/dL (ref 0.2–1.2)
Total Protein: 7.7 g/dL (ref 6.0–8.3)

## 2017-10-28 LAB — MICROALBUMIN / CREATININE URINE RATIO
Creatinine,U: 57.4 mg/dL
Microalb Creat Ratio: 1.2 mg/g (ref 0.0–30.0)
Microalb, Ur: <0.7 mg/dL (ref 0.0–1.9)

## 2017-10-28 LAB — LIPID PANEL
Cholesterol: 175 mg/dL (ref 0–200)
HDL: 38.2 mg/dL — ABNORMAL LOW (ref >39.00)
Total CHOL/HDL Ratio: 5
Triglycerides: 510 mg/dL — ABNORMAL HIGH (ref 0.0–149.0)

## 2017-10-28 LAB — BASIC METABOLIC PANEL
BUN: 22 mg/dL (ref 6–23)
CO2: 24 mEq/L (ref 19–32)
Calcium: 9.9 mg/dL (ref 8.4–10.5)
Chloride: 102 mEq/L (ref 96–112)
Creatinine, Ser: 0.78 mg/dL (ref 0.40–1.50)
GFR: 108.47 mL/min (ref >60.00)
Glucose, Bld: 171 mg/dL — ABNORMAL HIGH (ref 70–99)
Potassium: 4.5 mEq/L (ref 3.5–5.1)
Sodium: 136 mEq/L (ref 135–145)

## 2017-10-28 LAB — PSA: PSA: 0.64 ng/mL (ref 0.10–4.00)

## 2017-10-28 LAB — HEMOGLOBIN A1C: Hgb A1c MFr Bld: 7.6 % — ABNORMAL HIGH (ref 4.6–6.5)

## 2017-10-28 LAB — TSH: TSH: 2.42 u[IU]/mL (ref 0.35–4.50)

## 2017-10-28 LAB — LDL CHOLESTEROL, DIRECT: Direct LDL: 66 mg/dL

## 2017-10-28 NOTE — Progress Notes (Signed)
Subjective:     Patient ID: Dylan Hamilton, male   DOB: Jan 20, 1961, 57 y.o.   MRN: [id number]  HPI Patient seen for physical exam. He has history of type 2 diabetes and dyslipidemia. He's had elevated blood pressure the past but currently stable off medication. His blood sugars well controlled last summer but he was following "keto"diet then. A1c 6.2%. He stopped taking trulicity about a month ago because of cost issues and is concerned that he may not be able to maintain invokana because of cost issues as well. He remains on metformin. Also takes simvastatin. Not monitoring blood sugars.  We have discussed other preventative issues such as Pneumovax, flu vaccine, shingles vaccine and he declines. His colonoscopies up-to-date. Tetanus up-to-date. Gets yearly eye exams. Sees dermatologist yearly. No history smoking.  Past Medical History:  Diagnosis Date  . Diabetes mellitus type II   . Hypercholesterolemia   . Hyperlipidemia    Past Surgical History:  Procedure Laterality Date  . I&D EXTREMITY  07/17/2012   Procedure: IRRIGATION AND DEBRIDEMENT EXTREMITY;  Surgeon: Johnette AbrahamHarrill C Coley, MD;  Location: [facility];  Service: Plastics;  Laterality: Right;  Repair Right thumb laceration  . INGUINAL HERNIA REPAIR  1991  . WISDOM TOOTH EXTRACTION      reports that  has never smoked. he has never used smokeless tobacco. He reports that he does not drink alcohol or use drugs. family history includes Cancer in his brother, father, and other; Diabetes in his brother, mother, and other; Stroke in his paternal grandfather. No Known Allergies   Review of Systems  Constitutional: Negative for activity change, appetite change, fatigue and fever.  HENT: Negative for congestion, ear pain and trouble swallowing.   Eyes: Negative for pain and visual disturbance.  Respiratory: Negative for cough, shortness of breath and wheezing.   Cardiovascular: Negative for chest pain and palpitations.  Gastrointestinal: Negative  for abdominal distention, abdominal pain, blood in stool, constipation, diarrhea, nausea, rectal pain and vomiting.  Endocrine: Negative for polydipsia and polyuria.  Genitourinary: Negative for dysuria, hematuria and testicular pain.  Musculoskeletal: Negative for arthralgias and joint swelling.  Skin: Negative for rash.  Neurological: Negative for dizziness, syncope and headaches.  Hematological: Negative for adenopathy.  Psychiatric/Behavioral: Negative for confusion and dysphoric mood.       Objective:   Physical Exam  Constitutional: He is oriented to person, place, and time. He appears well-developed and well-nourished. No distress.  HENT:  Head: Normocephalic and atraumatic.  Right Ear: External ear normal.  Left Ear: External ear normal.  Mouth/Throat: Oropharynx is clear and moist.  Eyes: Conjunctivae and EOM are normal. Pupils are equal, round, and reactive to light.  Neck: Normal range of motion. Neck supple. No thyromegaly present.  Cardiovascular: Normal rate, regular rhythm and normal heart sounds.  No murmur heard. Pulmonary/Chest: No respiratory distress. He has no wheezes. He has no rales.  Abdominal: Soft. Bowel sounds are normal. He exhibits no distension and no mass. There is no tenderness. There is no rebound and no guarding.  Musculoskeletal: He exhibits no edema.  Lymphadenopathy:    He has no cervical adenopathy.  Neurological: He is alert and oriented to person, place, and time. He displays normal reflexes. No cranial nerve deficit.  Skin: No rash noted.  Psychiatric: He has a normal mood and affect.       Assessment:     Physical exam. Several issues addressed as below    Plan:     -Obtain screening lab work.  Include A1c and urine micro-albumin -The natural history of prostate cancer and ongoing controversy regarding screening and potential treatment outcomes of prostate cancer has been discussed with the patient. The meaning of a false positive PSA  and a false negative PSA has been discussed. He indicates understanding of the limitations of this screening test and wishes to proceed with screening PSA testing. -Continue yearly eye exam -We expressed our concerns about blood sugars likely elevating with stopping trulicity. He plans to scale back carbohydrates greatly and we've recommended follow-up in 3 months to reassess  Kristian Covey MD  Primary Care at [facility]

## 2017-10-28 NOTE — Patient Instructions (Signed)
Consider OTC Vit d 2,000 IU per day. Get back on low carb diet.

## 2017-10-29 ENCOUNTER — Other Ambulatory Visit: Payer: Self-pay | Admitting: Family Medicine

## 2018-01-30 ENCOUNTER — Ambulatory Visit (INDEPENDENT_AMBULATORY_CARE_PROVIDER_SITE_OTHER): Payer: BLUE CROSS/BLUE SHIELD | Admitting: Family Medicine

## 2018-01-30 ENCOUNTER — Encounter: Payer: Self-pay | Admitting: Family Medicine

## 2018-01-30 VITALS — BP 120/80 | HR 62 | Temp 97.8°F | Wt 217.9 lb

## 2018-01-30 DIAGNOSIS — R221 Localized swelling, mass and lump, neck: Secondary | ICD-10-CM

## 2018-01-30 DIAGNOSIS — E119 Type 2 diabetes mellitus without complications: Secondary | ICD-10-CM

## 2018-01-30 DIAGNOSIS — E785 Hyperlipidemia, unspecified: Secondary | ICD-10-CM | POA: Diagnosis not present

## 2018-01-30 LAB — LIPID PANEL
Cholesterol: 71 mg/dL (ref 0–200)
HDL: 22.3 mg/dL — ABNORMAL LOW (ref >39.00)
LDL Cholesterol: 33 mg/dL (ref 0–99)
NonHDL: 48.76
Total CHOL/HDL Ratio: 3
Triglycerides: 78 mg/dL (ref 0.0–149.0)
VLDL: 15.6 mg/dL (ref 0.0–40.0)

## 2018-01-30 LAB — POCT GLYCOSYLATED HEMOGLOBIN (HGB A1C): Hemoglobin A1C: 6 % — AB (ref 4.0–5.6)

## 2018-01-30 MED ORDER — GLUCOSE BLOOD VI STRP: ORAL_STRIP | 3 refills | Status: DC

## 2018-01-30 NOTE — Patient Instructions (Signed)
We will set up CT of neck to further assess neck mass.

## 2018-01-30 NOTE — Progress Notes (Signed)
  Subjective:     Patient ID: Dylan Hamilton, male   DOB: December 02, 1960, 57 y.o.   MRN: [id number]  HPI Patient seen for medical follow-up. He was here for physical March had some poor compliance with diet and had gained some weight. Since that time he's gone back on Keto diet been very strict with his eating. He quit taking trulicity and invokana because of cost issues. He is currently taking only metformin and simvastatin. No polyuria or polydipsia  Dyslipidemia with triglycerides were > 500 at physical. No regular alcohol use. Remains on simvastatin  Right neck mass which is up near the superior aspect of the anterior cervical triangle possibly slowly growing over several months. Has not noted any adenopathy. No appetite or weight changes. No fevers or chills. No night sweats. No sore throat.  Past Medical History:  Diagnosis Date  . Diabetes mellitus type II   . Hypercholesterolemia   . Hyperlipidemia    Past Surgical History:  Procedure Laterality Date  . I&D EXTREMITY  07/17/2012   Procedure: IRRIGATION AND DEBRIDEMENT EXTREMITY;  Surgeon: Johnette AbrahamHarrill C Coley, MD;  Location: [facility];  Service: Plastics;  Laterality: Right;  Repair Right thumb laceration  . INGUINAL HERNIA REPAIR  1991  . WISDOM TOOTH EXTRACTION      reports that he has never smoked. He has never used smokeless tobacco. He reports that he does not drink alcohol or use drugs. family history includes Cancer in his brother, father, and other; Diabetes in his brother, mother, and other; Stroke in his paternal grandfather. No Known Allergies   Review of Systems  Constitutional: Negative for fatigue.  HENT: Negative for sore throat.   Eyes: Negative for visual disturbance.  Respiratory: Negative for cough, chest tightness and shortness of breath.   Cardiovascular: Negative for chest pain, palpitations and leg swelling.  Gastrointestinal: Negative for abdominal pain.  Endocrine: Negative for polydipsia and polyuria.   Neurological: Negative for dizziness, syncope, weakness, light-headedness and headaches.  Hematological: Negative for adenopathy. Does not bruise/bleed easily.       Objective:   Physical Exam  Constitutional: He is oriented to person, place, and time. He appears well-developed and well-nourished.  HENT:  Right Ear: External ear normal.  Left Ear: External ear normal.  Mouth/Throat: Oropharynx is clear and moist.  Eyes: Pupils are equal, round, and reactive to light.  Neck: Neck supple. No thyromegaly present.  Approximate 2 x 2 centimeter nontender mass which is on the right side of the neck about 3-4 cm below the lower lobe of the ear near the posterior aspect of the anterior cervical triangle.  Cardiovascular: Normal rate and regular rhythm.  Pulmonary/Chest: Effort normal and breath sounds normal. No respiratory distress. He has no wheezes. He has no rales.  Musculoskeletal: He exhibits no edema.  Neurological: He is alert and oriented to person, place, and time.       Assessment:     #1 type 2 diabetes greatly improved with A1c reduction 7.6-6.0%. Has been able to achieve this while stopping trulicity and invokana  #2 dyslipidemia with high triglycerides-likely reflecting poor diabetes control on recent visit  #3 right neck mass. Question lipoma.    Plan:     -Continue low glycemic diet -Discuss dietary management of high triglycerides -Repeat fasting lipid panel today -Set up CT neck to further evaluate right neck mass  Kristian CoveyBruce W Peni Rupard MD [place] [facility]

## 2018-02-01 ENCOUNTER — Other Ambulatory Visit: Payer: Self-pay | Admitting: Family Medicine

## 2018-02-01 DIAGNOSIS — E119 Type 2 diabetes mellitus without complications: Secondary | ICD-10-CM

## 2018-02-07 ENCOUNTER — Other Ambulatory Visit (INDEPENDENT_AMBULATORY_CARE_PROVIDER_SITE_OTHER): Payer: BLUE CROSS/BLUE SHIELD

## 2018-02-07 DIAGNOSIS — E119 Type 2 diabetes mellitus without complications: Secondary | ICD-10-CM | POA: Diagnosis not present

## 2018-02-07 LAB — BASIC METABOLIC PANEL
BUN: 17 mg/dL (ref 6–23)
CO2: 24 mEq/L (ref 19–32)
Calcium: 9.3 mg/dL (ref 8.4–10.5)
Chloride: 104 mEq/L (ref 96–112)
Creatinine, Ser: 0.75 mg/dL (ref 0.40–1.50)
GFR: 113.38 mL/min (ref >60.00)
Glucose, Bld: 181 mg/dL — ABNORMAL HIGH (ref 70–99)
Potassium: 4.4 mEq/L (ref 3.5–5.1)
Sodium: 137 mEq/L (ref 135–145)

## 2018-02-14 ENCOUNTER — Ambulatory Visit (INDEPENDENT_AMBULATORY_CARE_PROVIDER_SITE_OTHER)
Admission: RE | Admit: 2018-02-14 | Discharge: 2018-02-14 | Disposition: A | Discharge status: Home / Self Care | Payer: BLUE CROSS/BLUE SHIELD | Source: Ambulatory Visit | Attending: Family Medicine | Admitting: Family Medicine

## 2018-02-14 DIAGNOSIS — R221 Localized swelling, mass and lump, neck: Secondary | ICD-10-CM

## 2018-02-14 MED ORDER — IOPAMIDOL (ISOVUE-300) INJECTION 61%
75.0000 mL | Freq: Once | INTRAVENOUS | Status: AC | PRN
  Administered 2018-02-14: 75 mL via INTRAVENOUS

## 2018-05-25 DIAGNOSIS — H40013 Open angle with borderline findings, low risk, bilateral: Secondary | ICD-10-CM | POA: Diagnosis not present

## 2018-05-25 DIAGNOSIS — E119 Type 2 diabetes mellitus without complications: Secondary | ICD-10-CM | POA: Diagnosis not present

## 2018-05-25 DIAGNOSIS — H25013 Cortical age-related cataract, bilateral: Secondary | ICD-10-CM | POA: Diagnosis not present

## 2018-05-25 DIAGNOSIS — H35033 Hypertensive retinopathy, bilateral: Secondary | ICD-10-CM | POA: Diagnosis not present

## 2018-05-25 LAB — HM DIABETES EYE EXAM

## 2018-08-02 ENCOUNTER — Other Ambulatory Visit: Payer: Self-pay | Admitting: Family Medicine

## 2018-10-31 ENCOUNTER — Ambulatory Visit (INDEPENDENT_AMBULATORY_CARE_PROVIDER_SITE_OTHER): Payer: BLUE CROSS/BLUE SHIELD | Admitting: Family Medicine

## 2018-10-31 ENCOUNTER — Encounter: Payer: Self-pay | Admitting: Family Medicine

## 2018-10-31 ENCOUNTER — Other Ambulatory Visit: Payer: Self-pay

## 2018-10-31 VITALS — BP 126/76 | HR 68 | Temp 97.7°F | Ht 71.0 in | Wt 220.7 lb

## 2018-10-31 DIAGNOSIS — Z0000 Encounter for general adult medical examination without abnormal findings: Secondary | ICD-10-CM

## 2018-10-31 DIAGNOSIS — E119 Type 2 diabetes mellitus without complications: Secondary | ICD-10-CM

## 2018-10-31 LAB — LIPID PANEL
Cholesterol: 151 mg/dL (ref 0–200)
HDL: 33.6 mg/dL — ABNORMAL LOW (ref >39.00)
Total CHOL/HDL Ratio: 4
Triglycerides: 429 mg/dL — ABNORMAL HIGH (ref 0.0–149.0)

## 2018-10-31 LAB — CBC WITH DIFFERENTIAL/PLATELET
Basophils Absolute: 0.1 10*3/uL (ref 0.0–0.1)
Basophils Relative: 0.8 % (ref 0.0–3.0)
Eosinophils Absolute: 0.3 10*3/uL (ref 0.0–0.7)
Eosinophils Relative: 3.7 % (ref 0.0–5.0)
HCT: 40.9 % (ref 39.0–52.0)
Hemoglobin: 14.3 g/dL (ref 13.0–17.0)
Lymphocytes Relative: 31.1 % (ref 12.0–46.0)
Lymphs Abs: 2.2 10*3/uL (ref 0.7–4.0)
MCHC: 34.9 g/dL (ref 30.0–36.0)
MCV: 91.1 fl (ref 78.0–100.0)
Monocytes Absolute: 0.9 10*3/uL (ref 0.1–1.0)
Monocytes Relative: 12.9 % — ABNORMAL HIGH (ref 3.0–12.0)
Neutro Abs: 3.6 10*3/uL (ref 1.4–7.7)
Neutrophils Relative %: 51.5 % (ref 43.0–77.0)
Platelets: 246 10*3/uL (ref 150.0–400.0)
RBC: 4.49 Mil/uL (ref 4.22–5.81)
RDW: 13.3 % (ref 11.5–15.5)
WBC: 7 10*3/uL (ref 4.0–10.5)

## 2018-10-31 LAB — MICROALBUMIN / CREATININE URINE RATIO
Creatinine,U: 99.6 mg/dL
Microalb Creat Ratio: 0.7 mg/g (ref 0.0–30.0)
Microalb, Ur: <0.7 mg/dL (ref 0.0–1.9)

## 2018-10-31 LAB — HEPATIC FUNCTION PANEL
ALT: 27 U/L (ref 0–53)
AST: 20 U/L (ref 0–37)
Albumin: 4.7 g/dL (ref 3.5–5.2)
Alkaline Phosphatase: 44 U/L (ref 39–117)
Bilirubin, Direct: 0.1 mg/dL (ref 0.0–0.3)
Total Bilirubin: 0.5 mg/dL (ref 0.2–1.2)
Total Protein: 7.4 g/dL (ref 6.0–8.3)

## 2018-10-31 LAB — POCT GLYCOSYLATED HEMOGLOBIN (HGB A1C): Hemoglobin A1C: 7 % — AB (ref 4.0–5.6)

## 2018-10-31 LAB — BASIC METABOLIC PANEL
BUN: 22 mg/dL (ref 6–23)
CO2: 25 mEq/L (ref 19–32)
Calcium: 9.7 mg/dL (ref 8.4–10.5)
Chloride: 102 mEq/L (ref 96–112)
Creatinine, Ser: 0.8 mg/dL (ref 0.40–1.50)
GFR: 98.78 mL/min (ref >60.00)
Glucose, Bld: 166 mg/dL — ABNORMAL HIGH (ref 70–99)
Potassium: 4.3 mEq/L (ref 3.5–5.1)
Sodium: 137 mEq/L (ref 135–145)

## 2018-10-31 LAB — LDL CHOLESTEROL, DIRECT: Direct LDL: 64 mg/dL

## 2018-10-31 LAB — PSA: PSA: 0.67 ng/mL (ref 0.10–4.00)

## 2018-10-31 LAB — TSH: TSH: 2.82 u[IU]/mL (ref 0.35–4.50)

## 2018-10-31 NOTE — Progress Notes (Signed)
Subjective:     Patient ID: Dylan Hamilton, male   DOB: 06-Mar-1961, 58 y.o.   MRN: [id number]  HPI Patient is in for physical exam.  He has type 2 diabetes and hyperlipidemia.  He states he had poor compliance with diet and background the first year when on a modified keto diet.  His blood sugars have improved some since then.  He has no specific complaints today.  He had colonoscopy 2014 with 10-year follow-up.  Tetanus 2013.  He declines vaccinations including flu vaccine and Pneumovax.  No history of hepatitis C screening.  Low risk.  He does have umbilical hernia but for the most part asymptomatic.  He does not wish to do anything about that yet.  Past Medical History:  Diagnosis Date  . Diabetes mellitus type II   . Hypercholesterolemia   . Hyperlipidemia    Past Surgical History:  Procedure Laterality Date  . I&D EXTREMITY  07/17/2012   Procedure: IRRIGATION AND DEBRIDEMENT EXTREMITY;  Surgeon: Johnette Abraham, MD;  Location: [facility];  Service: Plastics;  Laterality: Right;  Repair Right thumb laceration  . INGUINAL HERNIA REPAIR  1991  . WISDOM TOOTH EXTRACTION      reports that he has never smoked. He has never used smokeless tobacco. He reports that he does not drink alcohol or use drugs. family history includes Cancer in his brother, father, and another family member; Diabetes in his brother, mother, and another family member; Stroke in his paternal grandfather. No Known Allergies   Review of Systems  Constitutional: Negative for activity change, appetite change, fatigue and fever.  HENT: Negative for congestion, ear pain and trouble swallowing.   Eyes: Negative for pain and visual disturbance.  Respiratory: Negative for cough, shortness of breath and wheezing.   Cardiovascular: Negative for chest pain and palpitations.  Gastrointestinal: Negative for abdominal distention, abdominal pain, blood in stool, constipation, diarrhea, nausea, rectal pain and vomiting.  Genitourinary:  Negative for dysuria, hematuria and testicular pain.  Musculoskeletal: Negative for arthralgias and joint swelling.  Skin: Negative for rash.  Neurological: Negative for dizziness, syncope and headaches.  Hematological: Negative for adenopathy.  Psychiatric/Behavioral: Negative for confusion and dysphoric mood.       Objective:   Physical Exam Constitutional:      General: He is not in acute distress.    Appearance: He is well-developed.  HENT:     Head: Normocephalic and atraumatic.     Right Ear: External ear normal.     Left Ear: External ear normal.  Eyes:     Conjunctiva/sclera: Conjunctivae normal.     Pupils: Pupils are equal, round, and reactive to light.  Neck:     Musculoskeletal: Normal range of motion and neck supple.     Thyroid: No thyromegaly.  Cardiovascular:     Rate and Rhythm: Normal rate and regular rhythm.     Heart sounds: Normal heart sounds. No murmur.  Pulmonary:     Effort: No respiratory distress.     Breath sounds: No wheezing or rales.  Abdominal:     General: Bowel sounds are normal. There is no distension.     Palpations: Abdomen is soft.     Tenderness: There is no abdominal tenderness. There is no guarding or rebound.     Comments: Umbilical hernia which is soft and nontender.  Easily reducible  Lymphadenopathy:     Cervical: No cervical adenopathy.  Skin:    Findings: No rash.  Neurological:  Mental Status: He is alert and oriented to person, place, and time.     Cranial Nerves: No cranial nerve deficit.     Deep Tendon Reflexes: Reflexes normal.        Assessment:     Physical exam.  Patient has type 2 diabetes and hyperlipidemia.  A1c today 7.0%.  He has umbilical hernia which is mostly asymptomatic.  We discussed the following    Plan:     -Check hepatitis C antibody -Obtain other labs including PSA and urine microalbumin screen -Continue current medications -Discussed possible surgical referral for his umbilical hernia  and at this point he wishes to observe -Offered Pneumovax and he declines -We will plan routine diabetes follow-up in about 6 months  Kristian Covey MD [place] [facility] at [facility]

## 2018-11-01 ENCOUNTER — Encounter: Payer: Self-pay | Admitting: Family Medicine

## 2018-11-01 LAB — HEPATITIS C ANTIBODY
Hepatitis C Ab: NONREACTIVE
SIGNAL TO CUT-OFF: 0.04 (ref <1.00)

## 2019-05-02 ENCOUNTER — Other Ambulatory Visit: Payer: Self-pay | Admitting: Family Medicine

## 2019-05-04 ENCOUNTER — Ambulatory Visit (INDEPENDENT_AMBULATORY_CARE_PROVIDER_SITE_OTHER): Payer: BC Managed Care – PPO | Admitting: Family Medicine

## 2019-05-04 ENCOUNTER — Encounter: Payer: Self-pay | Admitting: Family Medicine

## 2019-05-04 ENCOUNTER — Other Ambulatory Visit: Payer: Self-pay

## 2019-05-04 VITALS — BP 142/82 | HR 73 | Temp 98.2°F | Wt 225.4 lb

## 2019-05-04 DIAGNOSIS — E119 Type 2 diabetes mellitus without complications: Secondary | ICD-10-CM

## 2019-05-04 DIAGNOSIS — K429 Umbilical hernia without obstruction or gangrene: Secondary | ICD-10-CM | POA: Diagnosis not present

## 2019-05-04 DIAGNOSIS — R030 Elevated blood-pressure reading, without diagnosis of hypertension: Secondary | ICD-10-CM

## 2019-05-04 LAB — POCT GLYCOSYLATED HEMOGLOBIN (HGB A1C): Hemoglobin A1C: 8.4 % — AB (ref 4.0–5.6)

## 2019-05-04 MED ORDER — GLIMEPIRIDE 2 MG PO TABS: 2.0000 mg | ORAL_TABLET | Freq: Every day | ORAL | 3 refills | Status: DC

## 2019-05-04 NOTE — Patient Instructions (Signed)
Reduce sugars and starches and try to lose some weight  Let's plan on 3 month follow up.

## 2019-05-04 NOTE — Progress Notes (Signed)
Subjective:     Patient ID: Dylan Hamilton, male   DOB: 12-31-1960, 58 y.o.   MRN: [id number]  HPI Patient seen for medical follow-up.  Type 2 diabetes.  Recent poor compliance with diet.  Not monitoring blood sugars regularly.  No polyuria or polydipsia.  With physical back in March his A1c was 7.0%.  Takes metformin.  Had been on Trulicity and we had also looked at SGLT2 medications but he had to stop these because of cost.  He is able to reign his diabetes control back with weight loss  He has umbilical hernia and is recently had some itching around this.  He thinks he may want to get this repaired sometime early next year.  No recent pain  History of elevated blood pressure.  Currently not on therapy.  Recent urine microalbumin screen was normal.  No headaches or dizziness.  No chest pain.  Past Medical History:  Diagnosis Date  . Diabetes mellitus type II   . Hypercholesterolemia   . Hyperlipidemia    Past Surgical History:  Procedure Laterality Date  . I&D EXTREMITY  07/17/2012   Procedure: IRRIGATION AND DEBRIDEMENT EXTREMITY;  Surgeon: Dennie Bible, MD;  Location: [place];  Service: Plastics;  Laterality: Right;  Repair Right thumb laceration  . INGUINAL HERNIA REPAIR  1991  . WISDOM TOOTH EXTRACTION      reports that he has never smoked. He has never used smokeless tobacco. He reports that he does not drink alcohol or use drugs. family history includes Cancer in his brother, father, and another family member; Diabetes in his brother, mother, and another family member; Stroke in his paternal grandfather. No Known Allergies   Review of Systems  Constitutional: Negative for fatigue.  Eyes: Negative for visual disturbance.  Respiratory: Negative for cough, chest tightness and shortness of breath.   Cardiovascular: Negative for chest pain, palpitations and leg swelling.  Neurological: Negative for dizziness, syncope, weakness, light-headedness and headaches.       Objective:    Physical Exam Constitutional:      Appearance: He is well-developed.  HENT:     Right Ear: External ear normal.     Left Ear: External ear normal.  Eyes:     Pupils: Pupils are equal, round, and reactive to light.  Neck:     Musculoskeletal: Neck supple.     Thyroid: No thyromegaly.  Cardiovascular:     Rate and Rhythm: Normal rate and regular rhythm.  Pulmonary:     Effort: Pulmonary effort is normal. No respiratory distress.     Breath sounds: Normal breath sounds. No wheezing or rales.  Abdominal:     Comments: Umbilical hernia which is fairly large but is soft and nontender  Neurological:     Mental Status: He is alert and oriented to person, place, and time.        Assessment:     #1 type 2 diabetes with worsening control with A1c 8.4%.  Recent poor compliance with diet  #2 elevated blood pressure.  This has generally been controlled well in the past  #3 umbilical hernia which is mostly asymptomatic    Plan:     -Discussed multiple options for diabetes.  He is concerned about cost issues.  We agreed to start Amaryl 2 mg daily in addition of metformin tighten up diet and recheck A1c in 3 months  -Discussed possible surgical referral for umbilical hernia and he would like to wait till beginning of the year  -Consider  either ACE inhibitor or angiotensin receptor blocker for blood pressure but he would like to give this 3 months and try to lose some weight first.  If up at follow-up consider the above medication classes  Kristian CoveyBruce W Burchette MD [place] [facility]

## 2019-05-28 DIAGNOSIS — H40013 Open angle with borderline findings, low risk, bilateral: Secondary | ICD-10-CM | POA: Diagnosis not present

## 2019-05-28 DIAGNOSIS — H2513 Age-related nuclear cataract, bilateral: Secondary | ICD-10-CM | POA: Diagnosis not present

## 2019-05-28 DIAGNOSIS — H25013 Cortical age-related cataract, bilateral: Secondary | ICD-10-CM | POA: Diagnosis not present

## 2019-05-28 DIAGNOSIS — E119 Type 2 diabetes mellitus without complications: Secondary | ICD-10-CM | POA: Diagnosis not present

## 2019-05-28 LAB — HM DIABETES EYE EXAM

## 2019-06-25 ENCOUNTER — Other Ambulatory Visit: Payer: Self-pay

## 2019-06-25 DIAGNOSIS — Z20822 Contact with and (suspected) exposure to covid-19: Secondary | ICD-10-CM

## 2019-06-27 LAB — NOVEL CORONAVIRUS, NAA: SARS-CoV-2, NAA: DETECTED — AB

## 2019-08-02 ENCOUNTER — Other Ambulatory Visit: Payer: Self-pay

## 2019-08-03 ENCOUNTER — Ambulatory Visit: Payer: BC Managed Care – PPO | Admitting: Family Medicine

## 2019-08-07 ENCOUNTER — Telehealth: Payer: BC Managed Care – PPO | Admitting: Family Medicine

## 2019-08-27 DIAGNOSIS — L578 Other skin changes due to chronic exposure to nonionizing radiation: Secondary | ICD-10-CM | POA: Diagnosis not present

## 2019-08-27 DIAGNOSIS — D1801 Hemangioma of skin and subcutaneous tissue: Secondary | ICD-10-CM | POA: Diagnosis not present

## 2019-08-27 DIAGNOSIS — L821 Other seborrheic keratosis: Secondary | ICD-10-CM | POA: Diagnosis not present

## 2019-08-27 DIAGNOSIS — D485 Neoplasm of uncertain behavior of skin: Secondary | ICD-10-CM | POA: Diagnosis not present

## 2019-08-27 DIAGNOSIS — L439 Lichen planus, unspecified: Secondary | ICD-10-CM | POA: Diagnosis not present

## 2019-08-27 DIAGNOSIS — L570 Actinic keratosis: Secondary | ICD-10-CM | POA: Diagnosis not present

## 2019-08-27 DIAGNOSIS — D225 Melanocytic nevi of trunk: Secondary | ICD-10-CM | POA: Diagnosis not present

## 2019-10-27 ENCOUNTER — Other Ambulatory Visit: Payer: Self-pay | Admitting: Family Medicine

## 2019-11-01 ENCOUNTER — Other Ambulatory Visit: Payer: Self-pay

## 2019-11-02 ENCOUNTER — Encounter: Payer: Self-pay | Admitting: Family Medicine

## 2019-11-02 ENCOUNTER — Other Ambulatory Visit: Payer: Self-pay

## 2019-11-02 ENCOUNTER — Ambulatory Visit (INDEPENDENT_AMBULATORY_CARE_PROVIDER_SITE_OTHER): Payer: BC Managed Care – PPO | Admitting: Family Medicine

## 2019-11-02 VITALS — BP 132/84 | HR 72 | Temp 97.7°F | Ht 72.0 in | Wt 228.0 lb

## 2019-11-02 DIAGNOSIS — Z0000 Encounter for general adult medical examination without abnormal findings: Secondary | ICD-10-CM | POA: Diagnosis not present

## 2019-11-02 DIAGNOSIS — Z125 Encounter for screening for malignant neoplasm of prostate: Secondary | ICD-10-CM

## 2019-11-02 DIAGNOSIS — E785 Hyperlipidemia, unspecified: Secondary | ICD-10-CM

## 2019-11-02 DIAGNOSIS — E119 Type 2 diabetes mellitus without complications: Secondary | ICD-10-CM | POA: Diagnosis not present

## 2019-11-02 LAB — LIPID PANEL
Cholesterol: 159 mg/dL (ref 0–200)
HDL: 41.9 mg/dL (ref >39.00)
NonHDL: 116.62
Total CHOL/HDL Ratio: 4
Triglycerides: 358 mg/dL — ABNORMAL HIGH (ref 0.0–149.0)
VLDL: 71.6 mg/dL — ABNORMAL HIGH (ref 0.0–40.0)

## 2019-11-02 LAB — BASIC METABOLIC PANEL
BUN: 18 mg/dL (ref 6–23)
CO2: 24 mEq/L (ref 19–32)
Calcium: 9.3 mg/dL (ref 8.4–10.5)
Chloride: 100 mEq/L (ref 96–112)
Creatinine, Ser: 0.8 mg/dL (ref 0.40–1.50)
GFR: 98.44 mL/min (ref >60.00)
Glucose, Bld: 250 mg/dL — ABNORMAL HIGH (ref 70–99)
Potassium: 4.5 mEq/L (ref 3.5–5.1)
Sodium: 134 mEq/L — ABNORMAL LOW (ref 135–145)

## 2019-11-02 LAB — MICROALBUMIN / CREATININE URINE RATIO
Creatinine,U: 102.3 mg/dL
Microalb Creat Ratio: 0.7 mg/g (ref 0.0–30.0)
Microalb, Ur: <0.7 mg/dL (ref 0.0–1.9)

## 2019-11-02 LAB — CBC WITH DIFFERENTIAL/PLATELET
Basophils Absolute: 0.1 10*3/uL (ref 0.0–0.1)
Basophils Relative: 0.7 % (ref 0.0–3.0)
Eosinophils Absolute: 0.2 10*3/uL (ref 0.0–0.7)
Eosinophils Relative: 2.9 % (ref 0.0–5.0)
HCT: 41.4 % (ref 39.0–52.0)
Hemoglobin: 14.4 g/dL (ref 13.0–17.0)
Lymphocytes Relative: 31.5 % (ref 12.0–46.0)
Lymphs Abs: 2.4 10*3/uL (ref 0.7–4.0)
MCHC: 34.8 g/dL (ref 30.0–36.0)
MCV: 90.9 fl (ref 78.0–100.0)
Monocytes Absolute: 0.8 10*3/uL (ref 0.1–1.0)
Monocytes Relative: 11.2 % (ref 3.0–12.0)
Neutro Abs: 4 10*3/uL (ref 1.4–7.7)
Neutrophils Relative %: 53.7 % (ref 43.0–77.0)
Platelets: 263 10*3/uL (ref 150.0–400.0)
RBC: 4.55 Mil/uL (ref 4.22–5.81)
RDW: 12.8 % (ref 11.5–15.5)
WBC: 7.5 10*3/uL (ref 4.0–10.5)

## 2019-11-02 LAB — HEPATIC FUNCTION PANEL
ALT: 35 U/L (ref 0–53)
AST: 28 U/L (ref 0–37)
Albumin: 4.4 g/dL (ref 3.5–5.2)
Alkaline Phosphatase: 43 U/L (ref 39–117)
Bilirubin, Direct: 0.1 mg/dL (ref 0.0–0.3)
Total Bilirubin: 0.6 mg/dL (ref 0.2–1.2)
Total Protein: 7.3 g/dL (ref 6.0–8.3)

## 2019-11-02 LAB — TSH: TSH: 2.18 u[IU]/mL (ref 0.35–4.50)

## 2019-11-02 LAB — LDL CHOLESTEROL, DIRECT: Direct LDL: 78 mg/dL

## 2019-11-02 LAB — HEMOGLOBIN A1C: Hgb A1c MFr Bld: 10 % — ABNORMAL HIGH (ref 4.6–6.5)

## 2019-11-02 LAB — PSA: PSA: 0.91 ng/mL (ref 0.10–4.00)

## 2019-11-02 NOTE — Progress Notes (Signed)
Subjective:     Patient ID: Dylan Hamilton, male   DOB: 1961-01-16, 59 y.o.   MRN: [id number]  HPI Frederico is seen for physical exam.  He has history of type 2 diabetes and hyperlipidemia.  His diabetes been poorly controlled with A1c of 8.4% back in September.  He went back on glimepiride.  Not monitoring blood sugars.  Poor compliance with diet over the past year.  He states he had Covid back around November.  He basically had no symptoms and just had a positive test.  Has felt well since then.  Health maintenance reviewed  -Colonoscopy due 2024 -Tetanus due 2023 -He had eye exam October 2020 -Previous hepatitis C screen negative -Declines Pneumovax and shingles vaccines as well as flu vaccine  Past Medical History:  Diagnosis Date  . Diabetes mellitus type II   . Hypercholesterolemia   . Hyperlipidemia    Past Surgical History:  Procedure Laterality Date  . I & D EXTREMITY  07/17/2012   Procedure: IRRIGATION AND DEBRIDEMENT EXTREMITY;  Surgeon: Dennie Bible, MD;  Location: [place];  Service: Plastics;  Laterality: Right;  Repair Right thumb laceration  . INGUINAL HERNIA REPAIR  1991  . WISDOM TOOTH EXTRACTION      reports that he has never smoked. He has never used smokeless tobacco. He reports that he does not drink alcohol or use drugs. family history includes Cancer in his brother, father, and another family member; Diabetes in his brother, mother, and another family member; Stroke in his paternal grandfather. No Known Allergies     Review of Systems  Constitutional: Negative for activity change, appetite change, fatigue and fever.  HENT: Negative for congestion, ear pain and trouble swallowing.   Eyes: Negative for pain and visual disturbance.  Respiratory: Negative for cough, shortness of breath and wheezing.   Cardiovascular: Negative for chest pain and palpitations.  Gastrointestinal: Negative for abdominal distention, abdominal pain, blood in stool, constipation,  diarrhea, nausea, rectal pain and vomiting.  Endocrine: Negative for polydipsia and polyuria.  Genitourinary: Negative for dysuria, hematuria and testicular pain.  Musculoskeletal: Negative for arthralgias and joint swelling.  Skin: Negative for rash.  Neurological: Negative for dizziness, syncope and headaches.  Hematological: Negative for adenopathy.  Psychiatric/Behavioral: Negative for confusion and dysphoric mood.       Objective:   Physical Exam Constitutional:      General: He is not in acute distress.    Appearance: He is well-developed.  HENT:     Head: Normocephalic and atraumatic.     Right Ear: External ear normal.     Left Ear: External ear normal.  Eyes:     Conjunctiva/sclera: Conjunctivae normal.     Pupils: Pupils are equal, round, and reactive to light.  Neck:     Thyroid: No thyromegaly.  Cardiovascular:     Rate and Rhythm: Normal rate and regular rhythm.     Heart sounds: Normal heart sounds. No murmur.  Pulmonary:     Effort: No respiratory distress.     Breath sounds: No wheezing or rales.  Abdominal:     General: Bowel sounds are normal. There is no distension.     Palpations: Abdomen is soft. There is no mass.     Tenderness: There is no abdominal tenderness. There is no guarding or rebound.     Comments: He has umbilical hernia which is soft and nontender  Musculoskeletal:     Cervical back: Normal range of motion and neck supple.  Lymphadenopathy:  Cervical: No cervical adenopathy.  Skin:    Findings: No rash.     Comments: Feet reveal no skin lesions. Good distal foot pulses. Good capillary refill. No calluses. Normal sensation with monofilament testing   Neurological:     Mental Status: He is alert and oriented to person, place, and time.     Cranial Nerves: No cranial nerve deficit.     Deep Tendon Reflexes: Reflexes normal.        Assessment:     Physical exam.  Patient has type 2 diabetes with history of recent suboptimal  control.  Not monitoring blood sugars regularly.  We discussed the following health maintenance issues    Plan:     -Offered Pneumovax and shingles vaccine and he declines  -Check labs.  Include PSA, A1c, urine microalbumin screen  -We encouraged him to tighten up diet and lose some weight.  He is considering going back on GLP-1 medication if indicated by labs  -Continue with yearly eye exam  -We will determine diabetes interval follow-up based on labs above  Kristian Covey MD [place] [facility] at [facility]

## 2019-11-02 NOTE — Patient Instructions (Signed)
Preventive Care 41-61 Years Old, Male Preventive care refers to lifestyle choices and visits with your health care provider that can promote health and wellness. This includes:  A yearly physical exam. This is also called an annual well check.  Regular dental and eye exams.  Immunizations.  Screening for certain conditions.  Healthy lifestyle choices, such as eating a healthy diet, getting regular exercise, not using drugs or products that contain nicotine and tobacco, and limiting alcohol use. What can I expect for my preventive care visit? Physical exam Your health care provider will check:  Height and weight. These may be used to calculate body mass index (BMI), which is a measurement that tells if you are at a healthy weight.  Heart rate and blood pressure.  Your skin for abnormal spots. Counseling Your health care provider may ask you questions about:  Alcohol, tobacco, and drug use.  Emotional well-being.  Home and relationship well-being.  Sexual activity.  Eating habits.  Work and work Statistician. What immunizations do I need?  Influenza (flu) vaccine  This is recommended every year. Tetanus, diphtheria, and pertussis (Tdap) vaccine  You may need a Td booster every 10 years. Varicella (chickenpox) vaccine  You may need this vaccine if you have not already been vaccinated. Zoster (shingles) vaccine  You may need this after age 61. Measles, mumps, and rubella (MMR) vaccine  You may need at least one dose of MMR if you were born in 1957 or later. You may also need a second dose. Pneumococcal conjugate (PCV13) vaccine  You may need this if you have certain conditions and were not previously vaccinated. Pneumococcal polysaccharide (PPSV23) vaccine  You may need one or two doses if you smoke cigarettes or if you have certain conditions. Meningococcal conjugate (MenACWY) vaccine  You may need this if you have certain conditions. Hepatitis A  vaccine  You may need this if you have certain conditions or if you travel or work in places where you may be exposed to hepatitis A. Hepatitis B vaccine  You may need this if you have certain conditions or if you travel or work in places where you may be exposed to hepatitis B. Haemophilus influenzae type b (Hib) vaccine  You may need this if you have certain risk factors. Human papillomavirus (HPV) vaccine  If recommended by your health care provider, you may need three doses over 6 months. You may receive vaccines as individual doses or as more than one vaccine together in one shot (combination vaccines). Talk with your health care provider about the risks and benefits of combination vaccines. What tests do I need? Blood tests  Lipid and cholesterol levels. These may be checked every 5 years, or more frequently if you are over 61 years old.  Hepatitis C test.  Hepatitis B test. Screening  Lung cancer screening. You may have this screening every year starting at age 43 if you have a 30-pack-year history of smoking and currently smoke or have quit within the past 15 years.  Prostate cancer screening. Recommendations will vary depending on your family history and other risks.  Colorectal cancer screening. All adults should have this screening starting at age 61 and continuing until age 2. Your health care provider may recommend screening at age 14 if you are at increased risk. You will have tests every 1-10 years, depending on your results and the type of screening test.  Diabetes screening. This is done by checking your blood sugar (glucose) after you have not eaten  for a while (fasting). You may have this done every 1-3 years.  Sexually transmitted disease (STD) testing. Follow these instructions at home: Eating and drinking  Eat a diet that includes fresh fruits and vegetables, whole grains, lean protein, and low-fat dairy products.  Take vitamin and mineral supplements as  recommended by your health care provider.  Do not drink alcohol if your health care provider tells you not to drink.  If you drink alcohol: ? Limit how much you have to 0-2 drinks a day. ? Be aware of how much alcohol is in your drink. In the [place] one drink equals one 12 oz bottle of beer (355 mL), one 5 oz glass of wine (148 mL), or one 1 oz glass of hard liquor (44 mL). Lifestyle  Take daily care of your teeth and gums.  Stay active. Exercise for at least 30 minutes on 5 or more days each week.  Do not use any products that contain nicotine or tobacco, such as cigarettes, e-cigarettes, and chewing tobacco. If you need help quitting, ask your health care provider.  If you are sexually active, practice safe sex. Use a condom or other form of protection to prevent STIs (sexually transmitted infections).  Talk with your health care provider about taking a low-dose aspirin every day starting at age 61. What's next?  Go to your health care provider once a year for a well check visit.  Ask your health care provider how often you should have your eyes and teeth checked.  Stay up to date on all vaccines. This information is not intended to replace advice given to you by your health care provider. Make sure you discuss any questions you have with your health care provider. Document Revised: 08/03/2018 Document Reviewed: 08/03/2018 Elsevier Patient Education  2020 [employer].

## 2019-11-05 ENCOUNTER — Other Ambulatory Visit: Payer: Self-pay

## 2019-11-05 MED ORDER — TRULICITY 0.75 MG/0.5ML ~~LOC~~ SOAJ: 0.7500 mg | SUBCUTANEOUS | 1 refills | Status: DC

## 2020-02-05 ENCOUNTER — Ambulatory Visit: Payer: BC Managed Care – PPO | Admitting: Family Medicine

## 2020-03-17 DIAGNOSIS — L570 Actinic keratosis: Secondary | ICD-10-CM | POA: Diagnosis not present

## 2020-03-17 DIAGNOSIS — L821 Other seborrheic keratosis: Secondary | ICD-10-CM | POA: Diagnosis not present

## 2020-03-17 DIAGNOSIS — D1801 Hemangioma of skin and subcutaneous tissue: Secondary | ICD-10-CM | POA: Diagnosis not present

## 2020-03-17 DIAGNOSIS — L905 Scar conditions and fibrosis of skin: Secondary | ICD-10-CM | POA: Diagnosis not present

## 2020-04-20 ENCOUNTER — Other Ambulatory Visit: Payer: Self-pay | Admitting: Family Medicine

## 2020-05-15 ENCOUNTER — Other Ambulatory Visit: Payer: Self-pay | Admitting: Family Medicine

## 2020-05-30 DIAGNOSIS — H33322 Round hole, left eye: Secondary | ICD-10-CM | POA: Diagnosis not present

## 2020-05-30 DIAGNOSIS — E119 Type 2 diabetes mellitus without complications: Secondary | ICD-10-CM | POA: Diagnosis not present

## 2020-05-30 DIAGNOSIS — H2513 Age-related nuclear cataract, bilateral: Secondary | ICD-10-CM | POA: Diagnosis not present

## 2020-05-30 DIAGNOSIS — H40013 Open angle with borderline findings, low risk, bilateral: Secondary | ICD-10-CM | POA: Diagnosis not present

## 2020-05-30 LAB — HM DIABETES EYE EXAM

## 2020-11-03 ENCOUNTER — Encounter: Payer: Self-pay | Admitting: Family Medicine

## 2020-11-03 ENCOUNTER — Other Ambulatory Visit: Payer: Self-pay

## 2020-11-03 ENCOUNTER — Ambulatory Visit (INDEPENDENT_AMBULATORY_CARE_PROVIDER_SITE_OTHER): Payer: BC Managed Care – PPO | Admitting: Family Medicine

## 2020-11-03 VITALS — BP 132/72 | HR 76 | Temp 98.4°F | Ht 71.75 in | Wt 224.8 lb

## 2020-11-03 DIAGNOSIS — E119 Type 2 diabetes mellitus without complications: Secondary | ICD-10-CM

## 2020-11-03 DIAGNOSIS — Z0000 Encounter for general adult medical examination without abnormal findings: Secondary | ICD-10-CM | POA: Diagnosis not present

## 2020-11-03 DIAGNOSIS — Z125 Encounter for screening for malignant neoplasm of prostate: Secondary | ICD-10-CM | POA: Diagnosis not present

## 2020-11-03 LAB — LIPID PANEL
Cholesterol: 148 mg/dL (ref 0–200)
HDL: 41 mg/dL (ref >39.00)
NonHDL: 106.81
Total CHOL/HDL Ratio: 4
Triglycerides: 309 mg/dL — ABNORMAL HIGH (ref 0.0–149.0)
VLDL: 61.8 mg/dL — ABNORMAL HIGH (ref 0.0–40.0)

## 2020-11-03 LAB — CBC WITH DIFFERENTIAL/PLATELET
Basophils Absolute: 0 10*3/uL (ref 0.0–0.1)
Basophils Relative: 0.4 % (ref 0.0–3.0)
Eosinophils Absolute: 0.3 10*3/uL (ref 0.0–0.7)
Eosinophils Relative: 2.3 % (ref 0.0–5.0)
HCT: 41.4 % (ref 39.0–52.0)
Hemoglobin: 14.4 g/dL (ref 13.0–17.0)
Lymphocytes Relative: 20.9 % (ref 12.0–46.0)
Lymphs Abs: 2.4 10*3/uL (ref 0.7–4.0)
MCHC: 34.9 g/dL (ref 30.0–36.0)
MCV: 90.1 fl (ref 78.0–100.0)
Monocytes Absolute: 1.2 10*3/uL — ABNORMAL HIGH (ref 0.1–1.0)
Monocytes Relative: 10.6 % (ref 3.0–12.0)
Neutro Abs: 7.6 10*3/uL (ref 1.4–7.7)
Neutrophils Relative %: 65.8 % (ref 43.0–77.0)
Platelets: 267 10*3/uL (ref 150.0–400.0)
RBC: 4.6 Mil/uL (ref 4.22–5.81)
RDW: 12.9 % (ref 11.5–15.5)
WBC: 11.6 10*3/uL — ABNORMAL HIGH (ref 4.0–10.5)

## 2020-11-03 LAB — BASIC METABOLIC PANEL
BUN: 14 mg/dL (ref 6–23)
CO2: 25 mEq/L (ref 19–32)
Calcium: 9.5 mg/dL (ref 8.4–10.5)
Chloride: 100 mEq/L (ref 96–112)
Creatinine, Ser: 0.79 mg/dL (ref 0.40–1.50)
GFR: 95.88 mL/min (ref >60.00)
Glucose, Bld: 264 mg/dL — ABNORMAL HIGH (ref 70–99)
Potassium: 4.5 mEq/L (ref 3.5–5.1)
Sodium: 134 mEq/L — ABNORMAL LOW (ref 135–145)

## 2020-11-03 LAB — HEPATIC FUNCTION PANEL
ALT: 26 U/L (ref 0–53)
AST: 20 U/L (ref 0–37)
Albumin: 4.3 g/dL (ref 3.5–5.2)
Alkaline Phosphatase: 49 U/L (ref 39–117)
Bilirubin, Direct: 0.1 mg/dL (ref 0.0–0.3)
Total Bilirubin: 0.6 mg/dL (ref 0.2–1.2)
Total Protein: 7.4 g/dL (ref 6.0–8.3)

## 2020-11-03 LAB — PSA: PSA: 0.98 ng/mL (ref 0.10–4.00)

## 2020-11-03 LAB — POCT GLYCOSYLATED HEMOGLOBIN (HGB A1C): Hemoglobin A1C: 9.7 % — AB (ref 4.0–5.6)

## 2020-11-03 LAB — LDL CHOLESTEROL, DIRECT: Direct LDL: 76 mg/dL

## 2020-11-03 LAB — MICROALBUMIN / CREATININE URINE RATIO
Creatinine,U: 80.6 mg/dL
Microalb Creat Ratio: 0.9 mg/g (ref 0.0–30.0)
Microalb, Ur: <0.7 mg/dL (ref 0.0–1.9)

## 2020-11-03 LAB — TSH: TSH: 2.87 u[IU]/mL (ref 0.35–4.50)

## 2020-11-03 MED ORDER — EMPAGLIFLOZIN 10 MG PO TABS: 10.0000 mg | ORAL_TABLET | Freq: Every day | ORAL | 11 refills | Status: DC

## 2020-11-03 NOTE — Progress Notes (Signed)
Established Patient Office Visit  Subjective:  Patient ID: Dylan Hamilton, male    DOB: 02-Jan-1961  Age: 60 y.o. MRN: [id number]  CC:  Chief Complaint  Patient presents with  . Annual Exam    HPI Abdifatah Colquhoun presents for physical exam.  He has history of type 2 diabetes, dyslipidemia, history of actinic keratosis.  He sees dermatologist regularly.  He had been following a keto type diet but has been off this for some time.  Not monitoring blood sugars regularly.  Last A1c was 10.  Remains on glimepiride 2 mg daily and Metformin 1000 mg twice daily.  He was on Trulicity previously and did well with this but cost was prohibitive.  Has never been on SGLT2 medication to his knowledge.  Eye exam up-to-date.  Health maintenance reviewed  -He declines flu vaccine and Pneumovax.  He also declines COVID vaccine.  Tetanus up-to-date but will need booster next year. -Due for repeat colonoscopy in 2 years.  Family history-significant for father dying of lung cancer.  His mother is alive at age 72 has type 2 diabetes.  He has a brother with history of melanoma and type 2 diabetes.  No sisters.  Social history-he is married.  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  He has 6 children and 5 grandchildren.  Never smoked.  Past Medical History:  Diagnosis Date  . Diabetes mellitus type II   . Hypercholesterolemia   . Hyperlipidemia     Past Surgical History:  Procedure Laterality Date  . I & D EXTREMITY  07/17/2012   Procedure: IRRIGATION AND DEBRIDEMENT EXTREMITY;  Surgeon: Dennie Bible, MD;  Location: [facility];  Service: Plastics;  Laterality: Right;  Repair Right thumb laceration  . INGUINAL HERNIA REPAIR  1991  . WISDOM TOOTH EXTRACTION      Family History  Problem Relation Age of Onset  . Cancer Father        lung  . Diabetes Mother   . Diabetes Brother   . Cancer Brother        melanoma  . Cancer Other   . Diabetes Other   . Stroke Paternal Grandfather    . Colon cancer Neg Hx   . Rectal cancer Neg Hx   . Stomach cancer Neg Hx     Social History   Socioeconomic History  . Marital status: Married    Spouse name: Not on file  . Number of children: Not on file  . Years of education: Not on file  . Highest education level: Not on file  Occupational History  . Not on file  Tobacco Use  . Smoking status: Never Smoker  . Smokeless tobacco: Never Used  Vaping Use  . Vaping Use: Never used  Substance and Sexual Activity  . Alcohol use: No    Comment: occ socially  . Drug use: No  . Sexual activity: Not on file  Other Topics Concern  . Not on file  Social History Narrative  . Not on file   Social Determinants of Health   Financial Resource Strain: Not on file  Food Insecurity: Not on file  Transportation Needs: Not on file  Physical Activity: Not on file  Stress: Not on file  Social Connections: Not on file  Intimate Partner Violence: Not on file    Outpatient Medications Prior to Visit  Medication Sig Dispense Refill  . BAYER MICROLET LANCETS lancets Check blood sugars 1-2 times per day. DX: E11.9 180  each 3  . Blood Glucose Monitoring Suppl (BAYER CONTOUR NEXT MONITOR) w/Device KIT USE AS DIRECTED. 1 kit 0  . EFUDEX 5 % cream SMARTSIG:1 Sparingly Topical Twice Daily    . glimepiride (AMARYL) 2 MG tablet TAKE 1 TABLET (2 MG TOTAL) BY MOUTH DAILY BEFORE BREAKFAST. 90 tablet 3  . glucose blood (BAYER CONTOUR TEST) test strip Check blood sugars 1-2 times per day. DX: E11.9 180 each 3  . metFORMIN (GLUCOPHAGE) 500 MG tablet TAKE TWO TABLETS BY MOUTH EVERY MORNING AND TWO TABLETS EVERY EVENING. 360 tablet 1  . mometasone (ELOCON) 0.1 % lotion Apply topically daily. 60 mL 1  . simvastatin (ZOCOR) 20 MG tablet TAKE ONE TABLET BY MOUTH ONE TIME DAILY 90 tablet 1  . Dulaglutide (TRULICITY) 6.50 PT/4.6FK SOPN Inject 0.75 mg into the skin once a week. 4 pen 1   No facility-administered medications prior to visit.    No Known  Allergies  ROS Review of Systems  Constitutional: Negative for activity change, appetite change, fatigue and fever.  HENT: Negative for congestion, ear pain and trouble swallowing.   Eyes: Negative for pain and visual disturbance.  Respiratory: Negative for cough, shortness of breath and wheezing.   Cardiovascular: Negative for chest pain and palpitations.  Gastrointestinal: Negative for abdominal distention, abdominal pain, blood in stool, constipation, diarrhea, nausea, rectal pain and vomiting.  Endocrine: Negative for polydipsia and polyuria.  Genitourinary: Negative for dysuria, hematuria and testicular pain.  Musculoskeletal: Negative for arthralgias and joint swelling.  Skin: Negative for rash.  Neurological: Negative for dizziness, syncope and headaches.  Hematological: Negative for adenopathy.  Psychiatric/Behavioral: Negative for confusion and dysphoric mood.      Objective:    Physical Exam Constitutional:      General: He is not in acute distress.    Appearance: He is well-developed.  HENT:     Head: Normocephalic and atraumatic.     Right Ear: External ear normal.     Left Ear: External ear normal.  Eyes:     Conjunctiva/sclera: Conjunctivae normal.     Pupils: Pupils are equal, round, and reactive to light.  Neck:     Thyroid: No thyromegaly.  Cardiovascular:     Rate and Rhythm: Normal rate and regular rhythm.     Heart sounds: Normal heart sounds. No murmur heard.   Pulmonary:     Effort: No respiratory distress.     Breath sounds: No wheezing or rales.  Abdominal:     General: Bowel sounds are normal. There is no distension.     Palpations: Abdomen is soft.     Tenderness: There is no abdominal tenderness. There is no guarding or rebound.     Hernia: A hernia is present.     Comments: He has fairly large umbilical hernia which is soft and nontender  Musculoskeletal:     Cervical back: Normal range of motion and neck supple.     Right lower leg: No  edema.     Left lower leg: No edema.  Lymphadenopathy:     Cervical: No cervical adenopathy.  Skin:    Findings: No rash.     Comments: Feet reveal no skin lesions. Good distal foot pulses. Good capillary refill. No calluses. Normal sensation with monofilament testing   Neurological:     Mental Status: He is alert and oriented to person, place, and time.     Cranial Nerves: No cranial nerve deficit.     Deep Tendon Reflexes: Reflexes normal.  BP 132/72 (BP Location: Left Arm, Patient Position: Sitting, Cuff Size: Large)   Pulse 76   Temp 98.4 F (36.9 C) (Oral)   Ht 5' 11.75" (1.822 m)   Wt 224 lb 12.8 oz (102 kg)   SpO2 98%   BMI 30.70 kg/m  Wt Readings from Last 3 Encounters:  11/03/20 224 lb 12.8 oz (102 kg)  11/02/19 228 lb (103.4 kg)  05/04/19 225 lb 6.4 oz (102.2 kg)     Health Maintenance Due  Topic Date Due  . FOOT EXAM  11/01/2020  . URINE MICROALBUMIN  11/01/2020    There are no preventive care reminders to display for this patient.  Lab Results  Component Value Date   TSH 2.18 11/02/2019   Lab Results  Component Value Date   WBC 7.5 11/02/2019   HGB 14.4 11/02/2019   HCT 41.4 11/02/2019   MCV 90.9 11/02/2019   PLT 263.0 11/02/2019   Lab Results  Component Value Date   NA 134 (L) 11/02/2019   K 4.5 11/02/2019   CO2 24 11/02/2019   GLUCOSE 250 (H) 11/02/2019   BUN 18 11/02/2019   CREATININE 0.80 11/02/2019   BILITOT 0.6 11/02/2019   ALKPHOS 43 11/02/2019   AST 28 11/02/2019   ALT 35 11/02/2019   PROT 7.3 11/02/2019   ALBUMIN 4.4 11/02/2019   CALCIUM 9.3 11/02/2019   GFR 98.44 11/02/2019   Lab Results  Component Value Date   CHOL 159 11/02/2019   Lab Results  Component Value Date   HDL 41.90 11/02/2019   Lab Results  Component Value Date   LDLCALC 33 01/30/2018   Lab Results  Component Value Date   TRIG 358.0 (H) 11/02/2019   Lab Results  Component Value Date   CHOLHDL 4 11/02/2019   Lab Results  Component Value Date    HGBA1C 9.7 (A) 11/03/2020      Assessment & Plan:   Problem List Items Addressed This Visit      Unprioritized   Type 2 diabetes mellitus, controlled ([place])   Relevant Medications   empagliflozin (JARDIANCE) 10 MG TABS tablet   Other Relevant Orders   POC HgB A1c (Completed)   Microalbumin / creatinine urine ratio    Other Visit Diagnoses    Physical exam    -  Primary   Relevant Orders   Basic metabolic panel   Lipid panel   CBC with Differential/Platelet   TSH   Hepatic function panel   PSA   Microalbumin / creatinine urine ratio    We discussed several health maintenance issues. -He declines flu vaccine and Pneumovax.  Also declines COVID vaccine  -We strongly recommended tightening of his diabetes control.  We recommended trial of Jardiance 10 mg once daily if he can afford getting this.  If not we recommend he let us know.  Our next option would be to increase his glimepiride though less desirable because of weight gain potential and risk for hypoglycemia  -Check screening labs including PSA.  Also needs urine microalbumin screen.  -Recommend follow-up in 3 months to reassess A1c  Meds ordered this encounter  Medications  . empagliflozin (JARDIANCE) 10 MG TABS tablet    Sig: Take 1 tablet (10 mg total) by mouth daily before breakfast.    Dispense:  30 tablet    Refill:  11    Follow-up: Return in about 3 months (around 02/03/2021).    Carolann Littler, MD

## 2020-11-03 NOTE — Patient Instructions (Signed)

## 2020-11-10 ENCOUNTER — Other Ambulatory Visit: Payer: Self-pay | Admitting: Family Medicine

## 2020-11-10 NOTE — Telephone Encounter (Signed)
Rx is too expensive for patient.

## 2020-11-10 NOTE — Telephone Encounter (Signed)
That was my fear.  He is already on Metformin and glimepiride.  Question is whether his insurance covers any diabetic medications other than the Metformin and glimepiride at low cost.  Would  have him check with his insurance to see if they have a drug formulary for list drugs for treating type 2 diabetes that they cover.

## 2020-11-11 NOTE — Telephone Encounter (Signed)
Lets start Actos (Pioglitazone) 15 mg po  qd #90 with 3 refills

## 2020-11-11 NOTE — Telephone Encounter (Signed)
Reviewed pt's formulary - everything is tier 3 or higher except for Metformin, Glipizide, and Pioglitazone.

## 2020-11-20 ENCOUNTER — Other Ambulatory Visit: Payer: Self-pay | Admitting: Family Medicine

## 2021-02-03 ENCOUNTER — Other Ambulatory Visit: Payer: Self-pay

## 2021-02-03 ENCOUNTER — Encounter: Payer: Self-pay | Admitting: Family Medicine

## 2021-02-03 ENCOUNTER — Ambulatory Visit (INDEPENDENT_AMBULATORY_CARE_PROVIDER_SITE_OTHER): Payer: BC Managed Care – PPO | Admitting: Family Medicine

## 2021-02-03 VITALS — BP 140/74 | HR 73 | Temp 97.8°F | Wt 228.7 lb

## 2021-02-03 DIAGNOSIS — E785 Hyperlipidemia, unspecified: Secondary | ICD-10-CM

## 2021-02-03 DIAGNOSIS — E119 Type 2 diabetes mellitus without complications: Secondary | ICD-10-CM | POA: Diagnosis not present

## 2021-02-03 LAB — POCT GLYCOSYLATED HEMOGLOBIN (HGB A1C): Hemoglobin A1C: 9.5 % — AB (ref 4.0–5.6)

## 2021-02-03 MED ORDER — CONTOUR TEST VI STRP: ORAL_STRIP | 12 refills | Status: AC

## 2021-02-03 MED ORDER — GLIMEPIRIDE 4 MG PO TABS: 4.0000 mg | ORAL_TABLET | Freq: Every day | ORAL | 3 refills | Status: DC

## 2021-02-03 MED ORDER — PIOGLITAZONE HCL 30 MG PO TABS: 30.0000 mg | ORAL_TABLET | Freq: Every day | ORAL | 3 refills | Status: DC

## 2021-02-03 MED ORDER — MICROLET LANCETS MISC: 11 refills | Status: DC

## 2021-02-03 NOTE — Patient Instructions (Signed)
Go ahead and increase the Glimepiride to 4 mg daily  Go ahead and increase the Actos to 30 mg daily.

## 2021-02-03 NOTE — Progress Notes (Signed)
Established Patient Office Visit  Subjective:  Patient ID: Dylan Hamilton, male    DOB: 1961-07-18  Age: 60 y.o. MRN: [id number]  CC:  Chief Complaint  Patient presents with   Follow-up    diabetes    HPI Dylan Hamilton presents for medical follow-up.  He has type 2 diabetes which has been recently poorly controlled.  Last A1c 9.7%.  We tried to send in [place] but cost was prohibitive.  He basically has no coverage for Jardiance for GLP-1 medications and stated this would cost about $500 per month out of pocket.  He does remain on metformin, glimepiride, and recently added Actos.  Not checking blood sugars currently.  Has run out of lancets and test strips.  No recent hypoglycemic symptoms.  Denies any major polyuria or polydipsia.  Recent poor compliance with diet.  He is refusing insulin at this time.  Does remain on simvastatin for hyperlipidemia.  No myalgias.  No recent chest pains.  No dizziness.  Past Medical History:  Diagnosis Date   Diabetes mellitus type II    Hypercholesterolemia    Hyperlipidemia     Past Surgical History:  Procedure Laterality Date   I & D EXTREMITY  07/17/2012   Procedure: IRRIGATION AND DEBRIDEMENT EXTREMITY;  Surgeon: Dennie Bible, MD;  Location: [facility];  Service: Plastics;  Laterality: Right;  Repair Right thumb laceration   INGUINAL HERNIA REPAIR  1991   WISDOM TOOTH EXTRACTION      Family History  Problem Relation Age of Onset   Cancer Father        lung   Diabetes Mother    Diabetes Brother    Cancer Brother        melanoma   Cancer Other    Diabetes Other    Stroke Paternal Grandfather    Colon cancer Neg Hx    Rectal cancer Neg Hx    Stomach cancer Neg Hx     Social History   Socioeconomic History   Marital status: Married    Spouse name: Not on file   Number of children: Not on file   Years of education: Not on file   Highest education level: Not on file  Occupational History   Not on file  Tobacco Use   Smoking  status: Never   Smokeless tobacco: Never  Vaping Use   Vaping Use: Never used  Substance and Sexual Activity   Alcohol use: No    Comment: occ socially   Drug use: No   Sexual activity: Not on file  Other Topics Concern   Not on file  Social History Narrative   Not on file   Social Determinants of Health   Financial Resource Strain: Not on file  Food Insecurity: Not on file  Transportation Needs: Not on file  Physical Activity: Not on file  Stress: Not on file  Social Connections: Not on file  Intimate Partner Violence: Not on file    Outpatient Medications Prior to Visit  Medication Sig Dispense Refill   BAYER MICROLET LANCETS lancets Check blood sugars 1-2 times per day. DX: E11.9 180 each 3   Blood Glucose Monitoring Suppl (BAYER CONTOUR NEXT MONITOR) w/Device KIT USE AS DIRECTED. 1 kit 0   EFUDEX 5 % cream SMARTSIG:1 Sparingly Topical Twice Daily     glucose blood (BAYER CONTOUR TEST) test strip Check blood sugars 1-2 times per day. DX: E11.9 180 each 3   metFORMIN (GLUCOPHAGE) 500 MG tablet TAKE TWO TABLETS  BY MOUTH EVERY MORNING AND TWO TABLETS EVERY EVENING. 360 tablet 1   mometasone (ELOCON) 0.1 % lotion Apply topically daily. 60 mL 1   simvastatin (ZOCOR) 20 MG tablet TAKE ONE TABLET BY MOUTH ONE TIME DAILY 90 tablet 1   glimepiride (AMARYL) 2 MG tablet TAKE 1 TABLET (2 MG TOTAL) BY MOUTH DAILY BEFORE BREAKFAST. 90 tablet 3   glimepiride (AMARYL) 4 MG tablet Take 4 mg by mouth daily with breakfast.     pioglitazone (ACTOS) 15 MG tablet Take 1 tablet (15 mg total) by mouth daily. 90 tablet 3   pioglitazone (ACTOS) 30 MG tablet Take 30 mg by mouth daily.     No facility-administered medications prior to visit.    No Known Allergies  ROS Review of Systems  Constitutional:  Negative for fatigue.  Eyes:  Negative for visual disturbance.  Respiratory:  Negative for cough, chest tightness and shortness of breath.   Cardiovascular:  Negative for chest pain,  palpitations and leg swelling.  Endocrine: Negative for polydipsia and polyuria.  Genitourinary:  Negative for dysuria.  Neurological:  Negative for dizziness, syncope, weakness, light-headedness and headaches.     Objective:    Physical Exam Constitutional:      Appearance: He is well-developed.  Eyes:     Pupils: Pupils are equal, round, and reactive to light.  Neck:     Thyroid: No thyromegaly.  Cardiovascular:     Rate and Rhythm: Normal rate and regular rhythm.  Pulmonary:     Effort: Pulmonary effort is normal. No respiratory distress.     Breath sounds: Normal breath sounds. No wheezing or rales.  Neurological:     Mental Status: He is alert and oriented to person, place, and time.    BP 140/74 (BP Location: Left Arm, Patient Position: Sitting, Cuff Size: Normal)   Pulse 73   Temp 97.8 F (36.6 C) (Oral)   Wt 228 lb 11.2 oz (103.7 kg)   SpO2 97%   BMI 31.23 kg/m  Wt Readings from Last 3 Encounters:  02/03/21 228 lb 11.2 oz (103.7 kg)  11/03/20 224 lb 12.8 oz (102 kg)  11/02/19 228 lb (103.4 kg)     Health Maintenance Due  Topic Date Due   COVID-19 Vaccine (1) Never done   Pneumococcal Vaccine 68-37 Years old (1 - PCV) Never done   Zoster Vaccines- Shingrix (1 of 2) Never done    There are no preventive care reminders to display for this patient.  Lab Results  Component Value Date   TSH 2.87 11/03/2020   Lab Results  Component Value Date   WBC 11.6 (H) 11/03/2020   HGB 14.4 11/03/2020   HCT 41.4 11/03/2020   MCV 90.1 11/03/2020   PLT 267.0 11/03/2020   Lab Results  Component Value Date   NA 134 (L) 11/03/2020   K 4.5 11/03/2020   CO2 25 11/03/2020   GLUCOSE 264 (H) 11/03/2020   BUN 14 11/03/2020   CREATININE 0.79 11/03/2020   BILITOT 0.6 11/03/2020   ALKPHOS 49 11/03/2020   AST 20 11/03/2020   ALT 26 11/03/2020   PROT 7.4 11/03/2020   ALBUMIN 4.3 11/03/2020   CALCIUM 9.5 11/03/2020   GFR 95.88 11/03/2020   Lab Results  Component Value  Date   CHOL 148 11/03/2020   Lab Results  Component Value Date   HDL 41.00 11/03/2020   Lab Results  Component Value Date   LDLCALC 33 01/30/2018   Lab Results  Component Value  Date   TRIG 309.0 (H) 11/03/2020   Lab Results  Component Value Date   CHOLHDL 4 11/03/2020   Lab Results  Component Value Date   HGBA1C 9.5 (A) 02/03/2021      Assessment & Plan:   #1 type 2 diabetes poorly controlled with A1c only minimally improved to 9.5%.  Patient is refusing insulin.  We would ideally like to have him on SGLT2 or GLP-1 medication but he cannot afford secondary to cost.  -Continue metformin -Increase glimepiride to 4 mg daily -Increase Actos to 30 mg daily.  He has had no side effects with the lower15 mg dose.  No history of heart failure. -62-monthfollow-up -Refills given for Contour test strips and lancets and also sent in new prescriptions for new doses of glimepiride and Actos.  #2 dyslipidemia treated with simvastatin -Continue simvastatin at current dosage   Meds ordered this encounter  Medications   glucose blood (CONTOUR TEST) test strip    Sig: Use as instructed    Dispense:  100 each    Refill:  12   Microlet Lancets MISC    Sig: Use as directed twice daily    Dispense:  60 each    Refill:  11   glimepiride (AMARYL) 4 MG tablet    Sig: Take 1 tablet (4 mg total) by mouth daily with breakfast.    Dispense:  90 tablet    Refill:  3   pioglitazone (ACTOS) 30 MG tablet    Sig: Take 1 tablet (30 mg total) by mouth daily.    Dispense:  90 tablet    Refill:  3    Follow-up: Return in about 3 months (around 05/06/2021).    BCarolann Littler MD

## 2021-02-21 ENCOUNTER — Other Ambulatory Visit: Payer: Self-pay | Admitting: Family Medicine

## 2021-03-18 ENCOUNTER — Other Ambulatory Visit: Payer: Self-pay

## 2021-03-18 ENCOUNTER — Encounter: Payer: Self-pay | Admitting: Family Medicine

## 2021-03-18 ENCOUNTER — Ambulatory Visit (INDEPENDENT_AMBULATORY_CARE_PROVIDER_SITE_OTHER): Payer: BC Managed Care – PPO | Admitting: Family Medicine

## 2021-03-18 VITALS — BP 160/86 | HR 81 | Temp 98.4°F | Ht 71.5 in | Wt 228.2 lb

## 2021-03-18 DIAGNOSIS — R220 Localized swelling, mass and lump, head: Secondary | ICD-10-CM | POA: Diagnosis not present

## 2021-03-18 MED ORDER — AMOXICILLIN-POT CLAVULANATE 875-125 MG PO TABS: 1.0000 | ORAL_TABLET | Freq: Two times a day (BID) | ORAL | 0 refills | Status: DC

## 2021-03-18 NOTE — Progress Notes (Signed)
Established Patient Office Visit  Subjective:  Patient ID: Dylan Hamilton, male    DOB: [birth year]  Age: 60 y.o. MRN: [id number]  CC:  Chief Complaint  Patient presents with   Facial Swelling    HPI Dylan Hamilton presents for some left-sided facial swelling noted inferior portion of the mandible which started around Sunday.  Denies any dental pain.  No ear pain.  Swelling is been relatively stable and unchanged since then.  Does not notice any fluctuation with meals.  Question of chills last night but no documented fever.  He thinks there may be some mild redness.  No history of MRSA.  He does have type 2 diabetes and has tightened up his diet recently and home blood sugars are improving.  Denies any past history of known parotid duct stones.  Denies any recent trauma.  No swallowing difficulties.  No sore throat.  Appetite has been good  Past Medical History:  Diagnosis Date   Diabetes mellitus type II    Hypercholesterolemia    Hyperlipidemia     Past Surgical History:  Procedure Laterality Date   I & D EXTREMITY  07/17/2012   Procedure: IRRIGATION AND DEBRIDEMENT EXTREMITY;  Surgeon: Dennie Bible, MD;  Location: [facility];  Service: Plastics;  Laterality: Right;  Repair Right thumb laceration   INGUINAL HERNIA REPAIR  1991   WISDOM TOOTH EXTRACTION      Family History  Problem Relation Age of Onset   Cancer Father        lung   Diabetes Mother    Diabetes Brother    Cancer Brother        melanoma   Cancer Other    Diabetes Other    Stroke Paternal Grandfather    Colon cancer Neg Hx    Rectal cancer Neg Hx    Stomach cancer Neg Hx     Social History   Socioeconomic History   Marital status: Married    Spouse name: Not on file   Number of children: Not on file   Years of education: Not on file   Highest education level: Not on file  Occupational History   Not on file  Tobacco Use   Smoking status: Never   Smokeless tobacco: Never  Vaping Use   Vaping Use: Never  used  Substance and Sexual Activity   Alcohol use: No    Comment: occ socially   Drug use: No   Sexual activity: Not on file  Other Topics Concern   Not on file  Social History Narrative   Not on file   Social Determinants of Health   Financial Resource Strain: Not on file  Food Insecurity: Not on file  Transportation Needs: Not on file  Physical Activity: Not on file  Stress: Not on file  Social Connections: Not on file  Intimate Partner Violence: Not on file    Outpatient Medications Prior to Visit  Medication Sig Dispense Refill   BAYER MICROLET LANCETS lancets Check blood sugars 1-2 times per day. DX: E11.9 180 each 3   Blood Glucose Monitoring Suppl (BAYER CONTOUR NEXT MONITOR) w/Device KIT USE AS DIRECTED. 1 kit 0   EFUDEX 5 % cream SMARTSIG:1 Sparingly Topical Twice Daily     glimepiride (AMARYL) 2 MG tablet TAKE 1 TABLET (2 MG TOTAL) BY MOUTH DAILY BEFORE BREAKFAST. 90 tablet 3   glimepiride (AMARYL) 4 MG tablet Take 1 tablet (4 mg total) by mouth daily with breakfast. 90 tablet 3  glucose blood (BAYER CONTOUR TEST) test strip Check blood sugars 1-2 times per day. DX: E11.9 180 each 3   glucose blood (CONTOUR TEST) test strip Use as instructed 100 each 12   metFORMIN (GLUCOPHAGE) 500 MG tablet TAKE TWO TABLETS BY MOUTH EVERY MORNING AND TWO TABLETS EVERY EVENING. 360 tablet 1   Microlet Lancets MISC Use as directed twice daily 60 each 11   mometasone (ELOCON) 0.1 % lotion Apply topically daily. 60 mL 1   pioglitazone (ACTOS) 30 MG tablet Take 1 tablet (30 mg total) by mouth daily. 90 tablet 3   simvastatin (ZOCOR) 20 MG tablet TAKE 1 TABLET BY MOUTH EVERY DAY 90 tablet 1   No facility-administered medications prior to visit.    No Known Allergies  ROS Review of Systems  Constitutional:  Negative for fever.  HENT:  Negative for ear pain, sore throat and trouble swallowing.   Respiratory:  Negative for cough and shortness of breath.   Cardiovascular:  Negative  for chest pain.  Neurological:  Negative for dizziness.     Objective:    Physical Exam Vitals reviewed.  Constitutional:      General: He is not in acute distress.    Appearance: Normal appearance. He is not ill-appearing or toxic-appearing.  HENT:     Head:     Comments: He has some mild swelling along the posterior aspect of the mandible and just inferior to this region along the posterior inferior portion of the parotid gland.  Very mild overlying erythema.  No fluctuance.  Mildly indurated.  No overlying pustules.  Minimally tender to palpation.    Mouth/Throat:     Mouth: Mucous membranes are moist.     Pharynx: Oropharynx is clear.     Comments: No visible swelling around Stensen's duct.   No gum erythema noted. Cardiovascular:     Rate and Rhythm: Normal rate and regular rhythm.  Pulmonary:     Effort: Pulmonary effort is normal.     Breath sounds: Normal breath sounds.  Neurological:     Mental Status: He is alert.    BP (!) 160/86   Pulse 81   Temp 98.4 F (36.9 C) (Oral)   Ht 5' 11.5" (1.816 m)   Wt 228 lb 3.2 oz (103.5 kg)   SpO2 97%   BMI 31.38 kg/m  Wt Readings from Last 3 Encounters:  03/18/21 228 lb 3.2 oz (103.5 kg)  02/03/21 228 lb 11.2 oz (103.7 kg)  11/03/20 224 lb 12.8 oz (102 kg)     Health Maintenance Due  Topic Date Due   COVID-19 Vaccine (1) Never done   Pneumococcal Vaccine 65-64 Years old (1 - PCV) Never done   Zoster Vaccines- Shingrix (1 of 2) Never done    There are no preventive care reminders to display for this patient.  Lab Results  Component Value Date   TSH 2.87 11/03/2020   Lab Results  Component Value Date   WBC 11.6 (H) 11/03/2020   HGB 14.4 11/03/2020   HCT 41.4 11/03/2020   MCV 90.1 11/03/2020   PLT 267.0 11/03/2020   Lab Results  Component Value Date   NA 134 (L) 11/03/2020   K 4.5 11/03/2020   CO2 25 11/03/2020   GLUCOSE 264 (H) 11/03/2020   BUN 14 11/03/2020   CREATININE 0.79 11/03/2020   BILITOT 0.6  11/03/2020   ALKPHOS 49 11/03/2020   AST 20 11/03/2020   ALT 26 11/03/2020   PROT 7.4 11/03/2020  ALBUMIN 4.3 11/03/2020   CALCIUM 9.5 11/03/2020   GFR 95.88 11/03/2020   Lab Results  Component Value Date   CHOL 148 11/03/2020   Lab Results  Component Value Date   HDL 41.00 11/03/2020   Lab Results  Component Value Date   LDLCALC 33 01/30/2018   Lab Results  Component Value Date   TRIG 309.0 (H) 11/03/2020   Lab Results  Component Value Date   CHOLHDL 4 11/03/2020   Lab Results  Component Value Date   HGBA1C 9.5 (A) 02/03/2021      Assessment & Plan:   Left facial swelling.  Question of early left parotid gland parotitis.  Patient is nontoxic in appearance.  Afebrile.  No fluctuance.  -We discussed seriousness of infections in this region and that we would have to have very low threshold to even admit him for any fever or any progressive redness or swelling.  At this point his symptoms are very mild and we started Augmentin 875 mg twice daily with food and will try some warm compresses.  If not promptly improving over the next couple days will recommend follow-up.  Consider imaging with ultrasound or CT if not improving over the next few days  Meds ordered this encounter  Medications   amoxicillin-clavulanate (AUGMENTIN) 875-125 MG tablet    Sig: Take 1 tablet by mouth 2 (two) times daily.    Dispense:  20 tablet    Refill:  0    Follow-up: No follow-ups on file.    Carolann Littler, MD

## 2021-03-18 NOTE — Progress Notes (Signed)
Pre visit review using our clinic review tool, if applicable. No additional management support is needed unless otherwise documented below in the visit note. 

## 2021-03-18 NOTE — Patient Instructions (Signed)
Watch closely for any fever, progressive swelling, recurrent vomiting.    If any of the above noted please follow up with [place] immediately.

## 2021-05-05 ENCOUNTER — Other Ambulatory Visit: Payer: Self-pay

## 2021-05-06 ENCOUNTER — Ambulatory Visit (INDEPENDENT_AMBULATORY_CARE_PROVIDER_SITE_OTHER): Payer: BC Managed Care – PPO | Admitting: Family Medicine

## 2021-05-06 VITALS — BP 130/80 | HR 86 | Temp 97.8°F | Wt 228.6 lb

## 2021-05-06 DIAGNOSIS — E119 Type 2 diabetes mellitus without complications: Secondary | ICD-10-CM

## 2021-05-06 LAB — POCT GLYCOSYLATED HEMOGLOBIN (HGB A1C): Hemoglobin A1C: 7.5 % — AB (ref 4.0–5.6)

## 2021-05-06 NOTE — Patient Instructions (Signed)
A1C has improved from 9.5 to 7.5%.    Let's plan on 6 month follow up.

## 2021-05-06 NOTE — Progress Notes (Signed)
Established Patient Office Visit  Subjective:  Patient ID: Dylan Hamilton, male    DOB: Dec 23, 1960  Age: 60 y.o. MRN: [id number]  CC:  Chief Complaint  Patient presents with   Follow-up    Diabetes     HPI Dylan Hamilton presents for medical follow-up.  Poorly controlled diabetes with last A1c 9.5%.  He has had cost issues with medication and we ended up adding Actos 30 mg daily.  Tolerating well with no side effects.  No edema.  Fasting blood sugar still ranging around 140-170.  Later day blood sugars usually low 100s he is trying to scale back sugars and starches.  Past Medical History:  Diagnosis Date   Diabetes mellitus type II    Hypercholesterolemia    Hyperlipidemia     Past Surgical History:  Procedure Laterality Date   I & D EXTREMITY  07/17/2012   Procedure: IRRIGATION AND DEBRIDEMENT EXTREMITY;  Surgeon: Dennie Bible, MD;  Location: [facility];  Service: Plastics;  Laterality: Right;  Repair Right thumb laceration   INGUINAL HERNIA REPAIR  1991   WISDOM TOOTH EXTRACTION      Family History  Problem Relation Age of Onset   Cancer Father        lung   Diabetes Mother    Diabetes Brother    Cancer Brother        melanoma   Cancer Other    Diabetes Other    Stroke Paternal Grandfather    Colon cancer Neg Hx    Rectal cancer Neg Hx    Stomach cancer Neg Hx     Social History   Socioeconomic History   Marital status: Married    Spouse name: Not on file   Number of children: Not on file   Years of education: Not on file   Highest education level: Not on file  Occupational History   Not on file  Tobacco Use   Smoking status: Never   Smokeless tobacco: Never  Vaping Use   Vaping Use: Never used  Substance and Sexual Activity   Alcohol use: No    Comment: occ socially   Drug use: No   Sexual activity: Not on file  Other Topics Concern   Not on file  Social History Narrative   Not on file   Social Determinants of Health   Financial Resource Strain:  Not on file  Food Insecurity: Not on file  Transportation Needs: Not on file  Physical Activity: Not on file  Stress: Not on file  Social Connections: Not on file  Intimate Partner Violence: Not on file    Outpatient Medications Prior to Visit  Medication Sig Dispense Refill   BAYER MICROLET LANCETS lancets Check blood sugars 1-2 times per day. DX: E11.9 180 each 3   Blood Glucose Monitoring Suppl (BAYER CONTOUR NEXT MONITOR) w/Device KIT USE AS DIRECTED. 1 kit 0   EFUDEX 5 % cream SMARTSIG:1 Sparingly Topical Twice Daily     glimepiride (AMARYL) 4 MG tablet Take 1 tablet (4 mg total) by mouth daily with breakfast. 90 tablet 3   glucose blood (BAYER CONTOUR TEST) test strip Check blood sugars 1-2 times per day. DX: E11.9 180 each 3   glucose blood (CONTOUR TEST) test strip Use as instructed 100 each 12   metFORMIN (GLUCOPHAGE) 500 MG tablet TAKE TWO TABLETS BY MOUTH EVERY MORNING AND TWO TABLETS EVERY EVENING. 360 tablet 1   Microlet Lancets MISC Use as directed twice daily 60 each  11   mometasone (ELOCON) 0.1 % lotion Apply topically daily. 60 mL 1   pioglitazone (ACTOS) 30 MG tablet Take 1 tablet (30 mg total) by mouth daily. 90 tablet 3   simvastatin (ZOCOR) 20 MG tablet TAKE 1 TABLET BY MOUTH EVERY DAY 90 tablet 1   amoxicillin-clavulanate (AUGMENTIN) 875-125 MG tablet Take 1 tablet by mouth 2 (two) times daily. 20 tablet 0   glimepiride (AMARYL) 2 MG tablet TAKE 1 TABLET (2 MG TOTAL) BY MOUTH DAILY BEFORE BREAKFAST. 90 tablet 3   No facility-administered medications prior to visit.    No Known Allergies  ROS Review of Systems  Constitutional:  Negative for appetite change and unexpected weight change.  Respiratory:  Negative for shortness of breath.   Cardiovascular:  Negative for chest pain.  Endocrine: Negative for polydipsia and polyuria.     Objective:    Physical Exam Vitals reviewed.  Constitutional:      Appearance: Normal appearance.  Cardiovascular:     Rate  and Rhythm: Normal rate and regular rhythm.  Pulmonary:     Effort: Pulmonary effort is normal.     Breath sounds: Normal breath sounds.  Neurological:     Mental Status: He is alert.    BP 130/80 (BP Location: Left Arm, Patient Position: Sitting, Cuff Size: Normal)   Pulse 86   Temp 97.8 F (36.6 C) (Oral)   Wt 228 lb 9.6 oz (103.7 kg)   SpO2 98%   BMI 31.44 kg/m  Wt Readings from Last 3 Encounters:  05/06/21 228 lb 9.6 oz (103.7 kg)  03/18/21 228 lb 3.2 oz (103.5 kg)  02/03/21 228 lb 11.2 oz (103.7 kg)     Health Maintenance Due  Topic Date Due   COVID-19 Vaccine (1) Never done   Pneumococcal Vaccine 59-74 Years old (1 - PCV) Never done   Zoster Vaccines- Shingrix (1 of 2) Never done    There are no preventive care reminders to display for this patient.  Lab Results  Component Value Date   TSH 2.87 11/03/2020   Lab Results  Component Value Date   WBC 11.6 (H) 11/03/2020   HGB 14.4 11/03/2020   HCT 41.4 11/03/2020   MCV 90.1 11/03/2020   PLT 267.0 11/03/2020   Lab Results  Component Value Date   NA 134 (L) 11/03/2020   K 4.5 11/03/2020   CO2 25 11/03/2020   GLUCOSE 264 (H) 11/03/2020   BUN 14 11/03/2020   CREATININE 0.79 11/03/2020   BILITOT 0.6 11/03/2020   ALKPHOS 49 11/03/2020   AST 20 11/03/2020   ALT 26 11/03/2020   PROT 7.4 11/03/2020   ALBUMIN 4.3 11/03/2020   CALCIUM 9.5 11/03/2020   GFR 95.88 11/03/2020   Lab Results  Component Value Date   CHOL 148 11/03/2020   Lab Results  Component Value Date   HDL 41.00 11/03/2020   Lab Results  Component Value Date   LDLCALC 33 01/30/2018   Lab Results  Component Value Date   TRIG 309.0 (H) 11/03/2020   Lab Results  Component Value Date   CHOLHDL 4 11/03/2020   Lab Results  Component Value Date   HGBA1C 7.5 (A) 05/06/2021      Assessment & Plan:   Type 2 diabetes improving with A1c today 7.5% which is down from 9.5%.  -Continue current medications.  We explained would like to see  this below 7.5% and he would like to give this another few months trial of lifestyle management and  then repeating A1c -Flu vaccine offered but declined -Continue annual diabetic eye exam   No orders of the defined types were placed in this encounter.   Follow-up: No follow-ups on file.    Carolann Littler, MD

## 2021-06-04 ENCOUNTER — Encounter: Payer: Self-pay | Admitting: Family Medicine

## 2021-06-04 DIAGNOSIS — H2513 Age-related nuclear cataract, bilateral: Secondary | ICD-10-CM | POA: Diagnosis not present

## 2021-06-04 DIAGNOSIS — E113293 Type 2 diabetes mellitus with mild nonproliferative diabetic retinopathy without macular edema, bilateral: Secondary | ICD-10-CM | POA: Diagnosis not present

## 2021-06-04 DIAGNOSIS — H35033 Hypertensive retinopathy, bilateral: Secondary | ICD-10-CM | POA: Diagnosis not present

## 2021-06-04 DIAGNOSIS — H40013 Open angle with borderline findings, low risk, bilateral: Secondary | ICD-10-CM | POA: Diagnosis not present

## 2021-06-04 LAB — HM DIABETES EYE EXAM

## 2021-10-25 ENCOUNTER — Other Ambulatory Visit: Payer: Self-pay | Admitting: Family Medicine

## 2021-11-04 ENCOUNTER — Encounter: Payer: Self-pay | Admitting: Family Medicine

## 2021-11-04 ENCOUNTER — Ambulatory Visit (INDEPENDENT_AMBULATORY_CARE_PROVIDER_SITE_OTHER): Payer: BC Managed Care – PPO | Admitting: Family Medicine

## 2021-11-04 ENCOUNTER — Other Ambulatory Visit: Payer: Self-pay | Admitting: Family Medicine

## 2021-11-04 VITALS — BP 140/70 | HR 73 | Temp 97.8°F | Ht 72.0 in | Wt 238.6 lb

## 2021-11-04 DIAGNOSIS — Z0000 Encounter for general adult medical examination without abnormal findings: Secondary | ICD-10-CM

## 2021-11-04 DIAGNOSIS — E785 Hyperlipidemia, unspecified: Secondary | ICD-10-CM | POA: Diagnosis not present

## 2021-11-04 DIAGNOSIS — Z125 Encounter for screening for malignant neoplasm of prostate: Secondary | ICD-10-CM

## 2021-11-04 DIAGNOSIS — E119 Type 2 diabetes mellitus without complications: Secondary | ICD-10-CM

## 2021-11-04 LAB — CBC WITH DIFFERENTIAL/PLATELET
Basophils Absolute: 0.1 10*3/uL (ref 0.0–0.1)
Basophils Relative: 0.6 % (ref 0.0–3.0)
Eosinophils Absolute: 0.2 10*3/uL (ref 0.0–0.7)
Eosinophils Relative: 2.9 % (ref 0.0–5.0)
HCT: 40.6 % (ref 39.0–52.0)
Hemoglobin: 13.9 g/dL (ref 13.0–17.0)
Lymphocytes Relative: 28.4 % (ref 12.0–46.0)
Lymphs Abs: 2.3 10*3/uL (ref 0.7–4.0)
MCHC: 34.2 g/dL (ref 30.0–36.0)
MCV: 91.7 fl (ref 78.0–100.0)
Monocytes Absolute: 0.9 10*3/uL (ref 0.1–1.0)
Monocytes Relative: 12 % (ref 3.0–12.0)
Neutro Abs: 4.4 10*3/uL (ref 1.4–7.7)
Neutrophils Relative %: 56.1 % (ref 43.0–77.0)
Platelets: 269 10*3/uL (ref 150.0–400.0)
RBC: 4.43 Mil/uL (ref 4.22–5.81)
RDW: 13.1 % (ref 11.5–15.5)
WBC: 7.9 10*3/uL (ref 4.0–10.5)

## 2021-11-04 LAB — BASIC METABOLIC PANEL
BUN: 19 mg/dL (ref 6–23)
CO2: 28 mEq/L (ref 19–32)
Calcium: 9.5 mg/dL (ref 8.4–10.5)
Chloride: 101 mEq/L (ref 96–112)
Creatinine, Ser: 0.9 mg/dL (ref 0.40–1.50)
GFR: 91.53 mL/min (ref >60.00)
Glucose, Bld: 155 mg/dL — ABNORMAL HIGH (ref 70–99)
Potassium: 4.3 mEq/L (ref 3.5–5.1)
Sodium: 137 mEq/L (ref 135–145)

## 2021-11-04 LAB — MICROALBUMIN / CREATININE URINE RATIO
Creatinine,U: 88.2 mg/dL
Microalb Creat Ratio: 0.8 mg/g (ref 0.0–30.0)
Microalb, Ur: <0.7 mg/dL (ref 0.0–1.9)

## 2021-11-04 LAB — LDL CHOLESTEROL, DIRECT: Direct LDL: 83 mg/dL

## 2021-11-04 LAB — HEPATIC FUNCTION PANEL
ALT: 27 U/L (ref 0–53)
AST: 22 U/L (ref 0–37)
Albumin: 4.6 g/dL (ref 3.5–5.2)
Alkaline Phosphatase: 40 U/L (ref 39–117)
Bilirubin, Direct: 0.1 mg/dL (ref 0.0–0.3)
Total Bilirubin: 0.5 mg/dL (ref 0.2–1.2)
Total Protein: 7.1 g/dL (ref 6.0–8.3)

## 2021-11-04 LAB — PSA: PSA: 0.76 ng/mL (ref 0.10–4.00)

## 2021-11-04 LAB — LIPID PANEL
Cholesterol: 157 mg/dL (ref 0–200)
HDL: 47.6 mg/dL (ref >39.00)
NonHDL: 108.97
Total CHOL/HDL Ratio: 3
Triglycerides: 214 mg/dL — ABNORMAL HIGH (ref 0.0–149.0)
VLDL: 42.8 mg/dL — ABNORMAL HIGH (ref 0.0–40.0)

## 2021-11-04 LAB — HEMOGLOBIN A1C: Hgb A1c MFr Bld: 8 % — ABNORMAL HIGH (ref 4.6–6.5)

## 2021-11-04 LAB — TSH: TSH: 3.17 u[IU]/mL (ref 0.35–5.50)

## 2021-11-04 MED ORDER — METFORMIN HCL 500 MG PO TABS
ORAL_TABLET | ORAL | 3 refills | Status: DC
Start: 2021-11-04 — End: 2022-02-24

## 2021-11-04 MED ORDER — TRULICITY 0.75 MG/0.5ML ~~LOC~~ SOAJ: 0.7500 mg | SUBCUTANEOUS | 5 refills | Status: DC

## 2021-11-04 NOTE — Progress Notes (Signed)
? ?Established Patient Office Visit ? ?Subjective:  ?Patient ID: Dylan Hamilton, male    DOB: Mar 21, 1961  Age: 61 y.o. MRN: [id number] ? ?CC:  ?Chief Complaint  ?Patient presents with  ? Annual Exam  ? ? ?HPI ?Dylan Hamilton presents for complete physical.  He has history of hyperlipidemia and type 2 diabetes.  Diabetes has been suboptimally controlled.  He had cost issues with Trulicity and Jardiance and is no longer taking those.  He does remain on metformin, glimepiride, and Actos.  Fasting sugar this morning was 113 which is the best has been in some time.  He does not monitor sugars regularly.  No significant polyuria.  Weight gain of 14 pounds from last year. ? ?Continues to work full-time.  He is looking at possible retirement in about 2 and half years. ? ?Health maintenance reviewed ? ?-Due for tetanus booster now but declines ?-Declines flu vaccine and shingles vaccine ?-Needs repeat colonoscopy next year ?-Does consent to repeat PSA today ? ? ?Family history-reviewed.  Only significant changes mother who is 4 diagnosed with dementia within the past year.  She does have type 2 diabetes.  He has a brother with melanoma and another brother with history of type 2 diabetes ? 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  6 grandchildren now.  He is married.  Non-smoker.  No regular alcohol. ? ?Past Medical History:  ?Diagnosis Date  ? Diabetes mellitus type II   ? Hypercholesterolemia   ? Hyperlipidemia   ? ? ?Past Surgical History:  ?Procedure Laterality Date  ? I & D EXTREMITY  07/17/2012  ? Procedure: IRRIGATION AND DEBRIDEMENT EXTREMITY;  Surgeon: Dennie Bible, MD;  Location: [facility];  Service: Plastics;  Laterality: Right;  Repair Right thumb laceration  ? INGUINAL HERNIA REPAIR  1991  ? WISDOM TOOTH EXTRACTION    ? ? ?Family History  ?Problem Relation Age of Onset  ? Cancer Father   ?     lung  ? Diabetes Mother   ? Diabetes Brother   ? Cancer Brother   ?     melanoma  ? Cancer Other   ?  Diabetes Other   ? Stroke Paternal Grandfather   ? Colon cancer Neg Hx   ? Rectal cancer Neg Hx   ? Stomach cancer Neg Hx   ? ? ?Social History  ? ?Socioeconomic History  ? Marital status: Married  ?  Spouse name: Not on file  ? Number of children: Not on file  ? Years of education: Not on file  ? Highest education level: Not on file  ?Occupational History  ? Not on file  ?Tobacco Use  ? Smoking status: Never  ? Smokeless tobacco: Never  ?Vaping Use  ? Vaping Use: Never used  ?Substance and Sexual Activity  ? Alcohol use: No  ?  Comment: occ socially  ? Drug use: No  ? Sexual activity: Not on file  ?Other Topics Concern  ? Not on file  ?Social History Narrative  ? Not on file  ? ?Social Determinants of Health  ? ?Financial Resource Strain: Not on file  ?Food Insecurity: Not on file  ?Transportation Needs: Not on file  ?Physical Activity: Not on file  ?Stress: Not on file  ?Social Connections: Not on file  ?Intimate Partner Violence: Not on file  ? ? ?Outpatient Medications Prior to Visit  ?Medication Sig Dispense Refill  ? BAYER MICROLET LANCETS lancets Check blood sugars 1-2 times per day. DX: E11.9  180 each 3  ? Blood Glucose Monitoring Suppl (BAYER CONTOUR NEXT MONITOR) w/Device KIT USE AS DIRECTED. 1 kit 0  ? EFUDEX 5 % cream SMARTSIG:1 Sparingly Topical Twice Daily    ? glimepiride (AMARYL) 4 MG tablet Take 1 tablet (4 mg total) by mouth daily with breakfast. 90 tablet 3  ? glucose blood (BAYER CONTOUR TEST) test strip Check blood sugars 1-2 times per day. DX: E11.9 180 each 3  ? glucose blood (CONTOUR TEST) test strip Use as instructed 100 each 12  ? Microlet Lancets MISC Use as directed twice daily 60 each 11  ? mometasone (ELOCON) 0.1 % lotion Apply topically daily. 60 mL 1  ? pioglitazone (ACTOS) 30 MG tablet Take 1 tablet (30 mg total) by mouth daily. 90 tablet 3  ? simvastatin (ZOCOR) 20 MG tablet TAKE 1 TABLET BY MOUTH EVERY DAY 30 tablet 0  ? metFORMIN (GLUCOPHAGE) 500 MG tablet TAKE TWO TABLETS BY  MOUTH EVERY MORNING AND TWO TABLETS EVERY EVENING. 360 tablet 1  ? ?No facility-administered medications prior to visit.  ? ? ?No Known Allergies ? ?ROS ?Review of Systems  ?Constitutional:  Negative for activity change, appetite change, fatigue and fever.  ?HENT:  Negative for congestion, ear pain and trouble swallowing.   ?Eyes:  Negative for pain and visual disturbance.  ?Respiratory:  Negative for cough, shortness of breath and wheezing.   ?Cardiovascular:  Negative for chest pain and palpitations.  ?Gastrointestinal:  Negative for abdominal distention, abdominal pain, blood in stool, constipation, diarrhea, nausea, rectal pain and vomiting.  ?Genitourinary:  Negative for dysuria, hematuria and testicular pain.  ?Musculoskeletal:  Negative for arthralgias and joint swelling.  ?Skin:  Negative for rash.  ?Neurological:  Negative for dizziness, syncope and headaches.  ?Hematological:  Negative for adenopathy.  ?Psychiatric/Behavioral:  Negative for confusion and dysphoric mood.   ? ?  ?Objective:  ?  ?Physical Exam ?Constitutional:   ?   General: He is not in acute distress. ?   Appearance: He is well-developed.  ?HENT:  ?   Head: Normocephalic and atraumatic.  ?   Right Ear: External ear normal.  ?   Left Ear: External ear normal.  ?Eyes:  ?   Conjunctiva/sclera: Conjunctivae normal.  ?   Pupils: Pupils are equal, round, and reactive to light.  ?Neck:  ?   Thyroid: No thyromegaly.  ?Cardiovascular:  ?   Rate and Rhythm: Normal rate and regular rhythm.  ?   Heart sounds: Normal heart sounds. No murmur heard. ?Pulmonary:  ?   Effort: No respiratory distress.  ?   Breath sounds: No wheezing or rales.  ?Abdominal:  ?   General: Bowel sounds are normal. There is no distension.  ?   Palpations: Abdomen is soft.  ?   Tenderness: There is no abdominal tenderness. There is no guarding or rebound.  ?   Comments: Fairly large umbilical hernia which is soft and nontender.  ?Musculoskeletal:  ?   Cervical back: Normal range  of motion and neck supple.  ?Lymphadenopathy:  ?   Cervical: No cervical adenopathy.  ?Skin: ?   Findings: No rash.  ?   Comments: Feet reveal no skin lesions. Good distal foot pulses. Good capillary refill. No calluses. Normal sensation with monofilament testing ?  ?Neurological:  ?   Mental Status: He is alert and oriented to person, place, and time.  ?   Cranial Nerves: No cranial nerve deficit.  ?   Deep Tendon Reflexes: Reflexes normal.  ? ? ?  BP 140/70 (BP Location: Left Arm, Patient Position: Sitting, Cuff Size: Normal)   Pulse 73   Temp 97.8 ?F (36.6 ?C) (Oral)   Ht 6' (1.829 m)   Wt 238 lb 9.6 oz (108.2 kg)   SpO2 95%   BMI 32.36 kg/m?  ?Wt Readings from Last 3 Encounters:  ?11/04/21 238 lb 9.6 oz (108.2 kg)  ?05/06/21 228 lb 9.6 oz (103.7 kg)  ?03/18/21 228 lb 3.2 oz (103.5 kg)  ? ? ? ?Health Maintenance Due  ?Topic Date Due  ? COVID-19 Vaccine (1) Never done  ? HIV Screening  Never done  ? FOOT EXAM  11/03/2021  ? HEMOGLOBIN A1C  11/03/2021  ? URINE MICROALBUMIN  11/03/2021  ? ? ?There are no preventive care reminders to display for this patient. ? ?Lab Results  ?Component Value Date  ? TSH 2.87 11/03/2020  ? ?Lab Results  ?Component Value Date  ? WBC 11.6 (H) 11/03/2020  ? HGB 14.4 11/03/2020  ? HCT 41.4 11/03/2020  ? MCV 90.1 11/03/2020  ? PLT 267.0 11/03/2020  ? ?Lab Results  ?Component Value Date  ? NA 134 (L) 11/03/2020  ? K 4.5 11/03/2020  ? CO2 25 11/03/2020  ? GLUCOSE 264 (H) 11/03/2020  ? BUN 14 11/03/2020  ? CREATININE 0.79 11/03/2020  ? BILITOT 0.6 11/03/2020  ? ALKPHOS 49 11/03/2020  ? AST 20 11/03/2020  ? ALT 26 11/03/2020  ? PROT 7.4 11/03/2020  ? ALBUMIN 4.3 11/03/2020  ? CALCIUM 9.5 11/03/2020  ? GFR 95.88 11/03/2020  ? ?Lab Results  ?Component Value Date  ? CHOL 148 11/03/2020  ? ?Lab Results  ?Component Value Date  ? HDL 41.00 11/03/2020  ? ?Lab Results  ?Component Value Date  ? Kathryn 33 01/30/2018  ? ?Lab Results  ?Component Value Date  ? TRIG 309.0 (H) 11/03/2020  ? ?Lab Results   ?Component Value Date  ? CHOLHDL 4 11/03/2020  ? ?Lab Results  ?Component Value Date  ? HGBA1C 7.5 (A) 05/06/2021  ? ? ?  ?Assessment & Plan:  ? ?Problem List Items Addressed This Visit   ?None ?Visit Diagnoses

## 2021-11-04 NOTE — Addendum Note (Signed)
Addended by: Kristian Covey on: 11/04/2021 12:46 PM ? ? Modules accepted: Orders ? ?

## 2021-11-04 NOTE — Telephone Encounter (Signed)
I initiated a PA for the pt's medication and this has been approved by the pt's insurance. Key-[id number] ?

## 2021-11-04 NOTE — Patient Instructions (Signed)
Monitor blood pressure at home and be in touch if consistently > 140/90 (goal < 130/80) ?

## 2021-11-18 ENCOUNTER — Other Ambulatory Visit: Payer: Self-pay | Admitting: Family Medicine

## 2021-12-18 DIAGNOSIS — L814 Other melanin hyperpigmentation: Secondary | ICD-10-CM | POA: Diagnosis not present

## 2021-12-18 DIAGNOSIS — D485 Neoplasm of uncertain behavior of skin: Secondary | ICD-10-CM | POA: Diagnosis not present

## 2021-12-18 DIAGNOSIS — L821 Other seborrheic keratosis: Secondary | ICD-10-CM | POA: Diagnosis not present

## 2021-12-18 DIAGNOSIS — L439 Lichen planus, unspecified: Secondary | ICD-10-CM | POA: Diagnosis not present

## 2021-12-18 DIAGNOSIS — L570 Actinic keratosis: Secondary | ICD-10-CM | POA: Diagnosis not present

## 2021-12-18 DIAGNOSIS — D1801 Hemangioma of skin and subcutaneous tissue: Secondary | ICD-10-CM | POA: Diagnosis not present

## 2021-12-25 ENCOUNTER — Other Ambulatory Visit: Payer: Self-pay | Admitting: Family Medicine

## 2021-12-25 DIAGNOSIS — D0462 Carcinoma in situ of skin of left upper limb, including shoulder: Secondary | ICD-10-CM | POA: Diagnosis not present

## 2022-02-05 ENCOUNTER — Encounter: Payer: Self-pay | Admitting: Family Medicine

## 2022-02-05 ENCOUNTER — Ambulatory Visit (INDEPENDENT_AMBULATORY_CARE_PROVIDER_SITE_OTHER): Payer: BC Managed Care – PPO | Admitting: Family Medicine

## 2022-02-05 VITALS — BP 136/80 | HR 62 | Temp 98.0°F | Ht 72.0 in | Wt 234.7 lb

## 2022-02-05 DIAGNOSIS — E119 Type 2 diabetes mellitus without complications: Secondary | ICD-10-CM

## 2022-02-05 LAB — POCT GLYCOSYLATED HEMOGLOBIN (HGB A1C): Hemoglobin A1C: 7.2 % — AB (ref 4.0–5.6)

## 2022-02-05 NOTE — Progress Notes (Signed)
Established Patient Office Visit  Subjective   Patient ID: Dylan Hamilton, male    DOB: 1961-07-28  Age: 61 y.o. MRN: [id number]  Chief Complaint  Patient presents with   Follow-up    HPI   Here for follow-up type 2 diabetes.  Last A1c 8.0%.  He is currently on Amaryl, Actos, and metformin.  We had added Trulicity last visit but there was a shortage at the pharmacy and he never got this filled.  However, he has made some very positive dietary changes and has greatly reduced his overall carbohydrate intake.  Not doing a true keto diet but has greatly restricted carbs and weight is down a little over 3 pounds.  Fasting blood sugar stable.  Past Medical History:  Diagnosis Date   Diabetes mellitus type II    Hypercholesterolemia    Hyperlipidemia    Past Surgical History:  Procedure Laterality Date   I & D EXTREMITY  07/17/2012   Procedure: IRRIGATION AND DEBRIDEMENT EXTREMITY;  Surgeon: Johnette Abraham, MD;  Location: [facility];  Service: Plastics;  Laterality: Right;  Repair Right thumb laceration   INGUINAL HERNIA REPAIR  1991   WISDOM TOOTH EXTRACTION      reports that he has never smoked. He has never used smokeless tobacco. He reports that he does not drink alcohol and does not use drugs. family history includes Cancer in his brother, father, and another family member; Diabetes in his brother, mother, and another family member; Stroke in his paternal grandfather. No Known Allergies  Review of Systems  Constitutional:  Negative for malaise/fatigue.  Endo/Heme/Allergies:  Negative for polydipsia.      Objective:     BP 136/80 (BP Location: Left Arm, Patient Position: Sitting, Cuff Size: Normal)   Pulse 62   Temp 98 F (36.7 C) (Oral)   Ht 6' (1.829 m)   Wt 234 lb 11.2 oz (106.5 kg)   SpO2 99%   BMI 31.83 kg/m    Physical Exam Constitutional:      Appearance: He is well-developed.  HENT:     Right Ear: External ear normal.     Left Ear: External ear normal.  Eyes:      Pupils: Pupils are equal, round, and reactive to light.  Neck:     Thyroid: No thyromegaly.  Cardiovascular:     Rate and Rhythm: Normal rate and regular rhythm.  Pulmonary:     Effort: Pulmonary effort is normal. No respiratory distress.     Breath sounds: Normal breath sounds. No wheezing or rales.  Musculoskeletal:     Cervical back: Neck supple.  Neurological:     Mental Status: He is alert and oriented to person, place, and time.      Results for orders placed or performed in visit on 02/05/22  POCT glycosylated hemoglobin (Hb A1C)  Result Value Ref Range   Hemoglobin A1C 7.2 (A) 4.0 - 5.6 %   HbA1c POC (<> result, manual entry)     HbA1c, POC (prediabetic range)     HbA1c, POC (controlled diabetic range)        The 10-year ASCVD risk score (Arnett DK, et al., 2019) is: 17.5%    Assessment & Plan:   Problem List Items Addressed This Visit       Unprioritized   Type 2 diabetes mellitus, controlled (HCC) - Primary   Relevant Orders   POCT glycosylated hemoglobin (Hb A1C) (Completed)  Improved with A1c reduction from 8.0 to 7.2%.  Continue  positive lifestyle changes.  At this point, he prefers to defer GLP-1 medication.  Set up follow-up within 6 months to reassess.  No follow-ups on file.    Evelena Peat, MD

## 2022-02-05 NOTE — Patient Instructions (Signed)
A1C improved from 8 to 7.2%    keep up the good work.

## 2022-02-22 ENCOUNTER — Telehealth: Payer: Self-pay | Admitting: Family Medicine

## 2022-02-22 NOTE — Telephone Encounter (Signed)
Aneta Mins with [facility] called to change pharmacy, as per Pt.   [facility] - [place], [place] - [address] Phone:  [phone]  Fax:  [phone]      All active routine meds should be sent to the above mentioned pharmacy, as of today 02/22/2022

## 2022-02-24 MED ORDER — SIMVASTATIN 20 MG PO TABS: 20.0000 mg | ORAL_TABLET | Freq: Every day | ORAL | 0 refills | Status: DC

## 2022-02-24 MED ORDER — PIOGLITAZONE HCL 30 MG PO TABS
30.0000 mg | ORAL_TABLET | Freq: Every day | ORAL | 3 refills | Status: AC
Start: 2022-02-24 — End: ?

## 2022-02-24 MED ORDER — GLIMEPIRIDE 4 MG PO TABS: 4.0000 mg | ORAL_TABLET | Freq: Every day | ORAL | 3 refills | Status: AC

## 2022-02-24 MED ORDER — METFORMIN HCL 500 MG PO TABS: ORAL_TABLET | ORAL | 3 refills | Status: DC

## 2022-02-24 NOTE — Telephone Encounter (Signed)
Rx done. 

## 2022-05-09 ENCOUNTER — Other Ambulatory Visit: Payer: Self-pay | Admitting: Family Medicine

## 2022-05-18 ENCOUNTER — Telehealth: Payer: Self-pay | Admitting: Family Medicine

## 2022-05-18 MED ORDER — MICROLET LANCETS MISC: 6 refills | Status: DC

## 2022-05-18 MED ORDER — GLUCOSE BLOOD VI STRP
ORAL_STRIP | 3 refills | Status: AC
Start: 2022-05-18 — End: ?

## 2022-05-18 NOTE — Telephone Encounter (Signed)
Rx sent 

## 2022-05-18 NOTE — Telephone Encounter (Signed)
Refill glucose blood (CONTOUR TEST) test strip, Microlet Lancets MISC  [place], [place] Phone:  [phone]  Fax:  [phone]

## 2022-06-21 ENCOUNTER — Telehealth: Payer: Self-pay | Admitting: Family Medicine

## 2022-06-21 NOTE — Telephone Encounter (Signed)
[place] both requested medications currently on file  for 1 year supply.   Called patient made aware

## 2022-06-21 NOTE — Telephone Encounter (Signed)
Requesting prescriptions for metFORMIN (GLUCOPHAGE) 500 MG tablet and pioglitazone (ACTOS) 30 MG tablet be sent to  [place], [place] Phone:  [phone]  Fax:  [phone]

## 2022-06-30 DIAGNOSIS — D1801 Hemangioma of skin and subcutaneous tissue: Secondary | ICD-10-CM | POA: Diagnosis not present

## 2022-06-30 DIAGNOSIS — L821 Other seborrheic keratosis: Secondary | ICD-10-CM | POA: Diagnosis not present

## 2022-06-30 DIAGNOSIS — Z08 Encounter for follow-up examination after completed treatment for malignant neoplasm: Secondary | ICD-10-CM | POA: Diagnosis not present

## 2022-06-30 DIAGNOSIS — L814 Other melanin hyperpigmentation: Secondary | ICD-10-CM | POA: Diagnosis not present

## 2022-06-30 DIAGNOSIS — L570 Actinic keratosis: Secondary | ICD-10-CM | POA: Diagnosis not present

## 2022-10-14 DIAGNOSIS — H33322 Round hole, left eye: Secondary | ICD-10-CM | POA: Diagnosis not present

## 2022-10-14 DIAGNOSIS — E113293 Type 2 diabetes mellitus with mild nonproliferative diabetic retinopathy without macular edema, bilateral: Secondary | ICD-10-CM | POA: Diagnosis not present

## 2022-10-14 DIAGNOSIS — H40013 Open angle with borderline findings, low risk, bilateral: Secondary | ICD-10-CM | POA: Diagnosis not present

## 2022-10-14 DIAGNOSIS — H25813 Combined forms of age-related cataract, bilateral: Secondary | ICD-10-CM | POA: Diagnosis not present

## 2022-10-14 LAB — HM DIABETES EYE EXAM

## 2022-11-04 ENCOUNTER — Encounter: Payer: Self-pay | Admitting: Gastroenterology

## 2022-11-08 ENCOUNTER — Ambulatory Visit (INDEPENDENT_AMBULATORY_CARE_PROVIDER_SITE_OTHER): Payer: BC Managed Care – PPO | Admitting: Family Medicine

## 2022-11-08 ENCOUNTER — Encounter: Payer: Self-pay | Admitting: Family Medicine

## 2022-11-08 VITALS — BP 154/78 | HR 75 | Temp 97.7°F | Ht 73.23 in | Wt 236.9 lb

## 2022-11-08 DIAGNOSIS — Z1211 Encounter for screening for malignant neoplasm of colon: Secondary | ICD-10-CM

## 2022-11-08 DIAGNOSIS — E119 Type 2 diabetes mellitus without complications: Secondary | ICD-10-CM

## 2022-11-08 DIAGNOSIS — Z0000 Encounter for general adult medical examination without abnormal findings: Secondary | ICD-10-CM

## 2022-11-08 LAB — CBC WITH DIFFERENTIAL/PLATELET
Basophils Absolute: 0.1 10*3/uL (ref 0.0–0.1)
Basophils Relative: 0.6 % (ref 0.0–3.0)
Eosinophils Absolute: 0.3 10*3/uL (ref 0.0–0.7)
Eosinophils Relative: 2.1 % (ref 0.0–5.0)
HCT: 41.3 % (ref 39.0–52.0)
Hemoglobin: 14 g/dL (ref 13.0–17.0)
Lymphocytes Relative: 26.3 % (ref 12.0–46.0)
Lymphs Abs: 3.1 10*3/uL (ref 0.7–4.0)
MCHC: 33.8 g/dL (ref 30.0–36.0)
MCV: 91.9 fl (ref 78.0–100.0)
Monocytes Absolute: 1.5 10*3/uL — ABNORMAL HIGH (ref 0.1–1.0)
Monocytes Relative: 12.6 % — ABNORMAL HIGH (ref 3.0–12.0)
Neutro Abs: 7 10*3/uL (ref 1.4–7.7)
Neutrophils Relative %: 58.4 % (ref 43.0–77.0)
Platelets: 280 10*3/uL (ref 150.0–400.0)
RBC: 4.5 Mil/uL (ref 4.22–5.81)
RDW: 13.3 % (ref 11.5–15.5)
WBC: 11.9 10*3/uL — ABNORMAL HIGH (ref 4.0–10.5)

## 2022-11-08 LAB — HEPATIC FUNCTION PANEL
ALT: 24 U/L (ref 0–53)
AST: 22 U/L (ref 0–37)
Albumin: 4.5 g/dL (ref 3.5–5.2)
Alkaline Phosphatase: 42 U/L (ref 39–117)
Bilirubin, Direct: 0.1 mg/dL (ref 0.0–0.3)
Total Bilirubin: 0.6 mg/dL (ref 0.2–1.2)
Total Protein: 7.6 g/dL (ref 6.0–8.3)

## 2022-11-08 LAB — MICROALBUMIN / CREATININE URINE RATIO
Creatinine,U: 44.2 mg/dL
Microalb Creat Ratio: 1.6 mg/g (ref 0.0–30.0)
Microalb, Ur: <0.7 mg/dL (ref 0.0–1.9)

## 2022-11-08 LAB — BASIC METABOLIC PANEL
BUN: 17 mg/dL (ref 6–23)
CO2: 26 mEq/L (ref 19–32)
Calcium: 9.7 mg/dL (ref 8.4–10.5)
Chloride: 99 mEq/L (ref 96–112)
Creatinine, Ser: 0.91 mg/dL (ref 0.40–1.50)
GFR: 89.69 mL/min (ref >60.00)
Glucose, Bld: 66 mg/dL — ABNORMAL LOW (ref 70–99)
Potassium: 4.3 mEq/L (ref 3.5–5.1)
Sodium: 136 mEq/L (ref 135–145)

## 2022-11-08 LAB — LIPID PANEL
Cholesterol: 213 mg/dL — ABNORMAL HIGH (ref 0–200)
HDL: 45.7 mg/dL (ref >39.00)
NonHDL: 167.01
Total CHOL/HDL Ratio: 5
Triglycerides: 233 mg/dL — ABNORMAL HIGH (ref 0.0–149.0)
VLDL: 46.6 mg/dL — ABNORMAL HIGH (ref 0.0–40.0)

## 2022-11-08 LAB — HEMOGLOBIN A1C: Hgb A1c MFr Bld: 7.2 % — ABNORMAL HIGH (ref 4.6–6.5)

## 2022-11-08 LAB — PSA: PSA: 1.17 ng/mL (ref 0.10–4.00)

## 2022-11-08 LAB — LDL CHOLESTEROL, DIRECT: Direct LDL: 139 mg/dL

## 2022-11-08 NOTE — Patient Instructions (Signed)
Monitor blood pressure at home and be in touch if consistently > [id number] systolic (top number).

## 2022-11-08 NOTE — Progress Notes (Signed)
Established Patient Office Visit  Subjective   Patient ID: Dylan Hamilton, male    DOB: 1961/03/11  Age: 62 y.o. MRN: [id number]  Chief Complaint  Patient presents with   Annual Exam    HPI   Dylan Hamilton is seen for physical exam.  He has had a very stressful year.  His 46 year old daughter was diagnosed with ocular melanoma and is seeking treatment at [facility].  His mother has dementia and has had some ongoing health decline.  [place] has history of type 2 diabetes, dyslipidemia, and elevated blood pressure without prior treatment for hypertension.  Not monitoring blood sugars much recently.  Last A1c was 7.2% but this was a year ago.  Health maintenance reviewed  -Due for colonoscopy now -Declines influenza vaccine as well as tetanus and Shingrix -Is getting regular diabetic eye exams yearly -Due for follow-up urine microalbumin screen and A1c  Family history-reviewed.  Only significant changes mother who is 74 diagnosed with dementia within the past year.  She does have type 2 diabetes.  He has a brother with melanoma and another brother with history of type 2 diabetes.  As above, daughter recently diagnosed with ocular melanoma.   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  6 grandchildren now.  He is married.  Non-smoker.  No regular alcohol. He is looking to retire in September 2025  Past Medical History:  Diagnosis Date   Diabetes mellitus type II    Hypercholesterolemia    Hyperlipidemia    Past Surgical History:  Procedure Laterality Date   I & D EXTREMITY  07/17/2012   Procedure: IRRIGATION AND DEBRIDEMENT EXTREMITY;  Surgeon: Dylan Bible, MD;  Location: [facility];  Service: Plastics;  Laterality: Right;  Repair Right thumb laceration   INGUINAL HERNIA REPAIR  1991   WISDOM TOOTH EXTRACTION      reports that he has never smoked. He has never used smokeless tobacco. He reports that he does not drink alcohol and does not use drugs. family history includes  Cancer in his brother, father, and another family member; Diabetes in his brother, mother, and another family member; Stroke in his paternal grandfather. No Known Allergies   Review of Systems  Constitutional:  Negative for chills, fever, malaise/fatigue and weight loss.  HENT:  Negative for hearing loss.   Eyes:  Negative for blurred vision and double vision.  Respiratory:  Negative for cough and shortness of breath.   Cardiovascular:  Negative for chest pain, palpitations and leg swelling.  Gastrointestinal:  Negative for abdominal pain, blood in stool, constipation and diarrhea.  Genitourinary:  Negative for dysuria.  Skin:  Negative for rash.  Neurological:  Negative for dizziness, speech change, seizures, loss of consciousness and headaches.  Psychiatric/Behavioral:  Negative for depression.       Objective:     BP (!) 154/78 (BP Location: Left Arm, Patient Position: Sitting, Cuff Size: Normal)   Pulse 75   Temp 97.7 F (36.5 C) (Oral)   Ht 6' 1.23" (1.86 m)   Wt 236 lb 14.4 oz (107.5 kg)   SpO2 98%   BMI 31.06 kg/m  BP Readings from Last 3 Encounters:  11/08/22 (!) 154/78  11/08/22 134/70  02/05/22 136/80   Wt Readings from Last 3 Encounters:  11/08/22 236 lb 14.4 oz (107.5 kg)  11/08/22 236 lb 14.4 oz (107.5 kg)  02/05/22 234 lb 11.2 oz (106.5 kg)      Physical Exam Constitutional:      General: He is  not in acute distress.    Appearance: He is well-developed.  HENT:     Head: Normocephalic and atraumatic.     Right Ear: External ear normal.     Left Ear: External ear normal.  Eyes:     Conjunctiva/sclera: Conjunctivae normal.     Pupils: Pupils are equal, round, and reactive to light.  Neck:     Thyroid: No thyromegaly.  Cardiovascular:     Rate and Rhythm: Normal rate and regular rhythm.     Heart sounds: Normal heart sounds. No murmur heard. Pulmonary:     Effort: No respiratory distress.     Breath sounds: No wheezing or rales.  Abdominal:      General: Bowel sounds are normal. There is no distension.     Palpations: Abdomen is soft. There is no mass.     Tenderness: There is no abdominal tenderness. There is no guarding or rebound.     Comments: Umbilical hernia which is soft and nontender  Musculoskeletal:     Cervical back: Normal range of motion and neck supple.  Lymphadenopathy:     Cervical: No cervical adenopathy.  Skin:    Findings: No rash.     Comments: Feet reveal no skin lesions. Good distal foot pulses. Good capillary refill. No calluses. Normal sensation with monofilament testing   Neurological:     Mental Status: He is alert and oriented to person, place, and time.     Cranial Nerves: No cranial nerve deficit.      No results found for any visits on 11/08/22.    The 10-year ASCVD risk score (Arnett DK, et al., 2019) is: 23.1%    Assessment & Plan:   #1 physical exam.  We discussed the following health maintenance items  -Offered tetanus and Shingrix and he declines -Set up repeat colonoscopy -Discussed PSA screening and he would like to continue with that -He does see dermatologist every 6 months for skin cancer screening.  He had prior squamous cell skin cancer  #2 type 2 diabetes with history of borderline control.  Recheck A1c today along with urine microalbumin screen.  Continue yearly eye exam  #3 elevated blood pressure.  We discussed medication options and also nonpharmacologic management.  He prefers to monitor this at home for several weeks and be in touch if consistent systolic readings over [id number].  Would have low threshold to consider ACE or ARB   No follow-ups on file.    Dylan Littler, MD

## 2022-11-09 MED ORDER — ROSUVASTATIN CALCIUM 20 MG PO TABS: ORAL_TABLET | ORAL | 0 refills | Status: AC

## 2022-11-09 NOTE — Progress Notes (Signed)
Pt seen yesterday and appropriate documentation and level of visit coded at that time.  Not sure why this popped up in my in basket today.  Eulas Post MD [facility]

## 2022-11-09 NOTE — Addendum Note (Signed)
Addended by: Nilda Riggs on: 11/09/2022 01:40 PM   Modules accepted: Orders

## 2022-11-09 NOTE — Addendum Note (Signed)
Addended by: Nilda Riggs on: 11/09/2022 01:41 PM   Modules accepted: Orders

## 2022-11-23 ENCOUNTER — Encounter: Payer: Self-pay | Admitting: Gastroenterology

## 2022-12-14 ENCOUNTER — Ambulatory Visit (AMBULATORY_SURGERY_CENTER): Payer: BC Managed Care – PPO

## 2022-12-14 ENCOUNTER — Encounter: Payer: Self-pay | Admitting: Gastroenterology

## 2022-12-14 VITALS — Ht 73.0 in | Wt 230.0 lb

## 2022-12-14 DIAGNOSIS — Z1211 Encounter for screening for malignant neoplasm of colon: Secondary | ICD-10-CM

## 2022-12-14 MED ORDER — NA SULFATE-K SULFATE-MG SULF 17.5-3.13-1.6 GM/177ML PO SOLN: 1.0000 | Freq: Once | ORAL | 0 refills | Status: AC

## 2022-12-14 NOTE — Progress Notes (Signed)
No egg or soy allergy known to patient  No issues known to pt with past sedation with any surgeries or procedures Patient denies ever being told they had issues or difficulty with intubation  No FH of Malignant Hyperthermia Pt is not on diet pills Pt is not on  home 02  Pt is not on blood thinners  Pt denies issues with constipation  No A fib or A flutter Have any cardiac testing pending--no Pt instructed to use Singlecare.com or GoodRx for a price reduction on prep   Patient's chart reviewed by Cathlyn Parsons CNRA prior to previsit and patient appropriate for the [facility].  Previsit completed and red dot placed by patient's name on their procedure day (on provider's schedule).    Ambulates without assistance

## 2023-01-20 ENCOUNTER — Encounter: Payer: Self-pay | Admitting: Gastroenterology

## 2023-01-20 ENCOUNTER — Ambulatory Visit (AMBULATORY_SURGERY_CENTER): Payer: BC Managed Care – PPO | Admitting: Gastroenterology

## 2023-01-20 VITALS — BP 147/82 | HR 58 | Temp 97.5°F | Resp 11 | Ht 73.0 in | Wt 230.0 lb

## 2023-01-20 DIAGNOSIS — K5730 Diverticulosis of large intestine without perforation or abscess without bleeding: Secondary | ICD-10-CM

## 2023-01-20 DIAGNOSIS — Z1211 Encounter for screening for malignant neoplasm of colon: Secondary | ICD-10-CM

## 2023-01-20 DIAGNOSIS — D125 Benign neoplasm of sigmoid colon: Secondary | ICD-10-CM | POA: Diagnosis not present

## 2023-01-20 DIAGNOSIS — K635 Polyp of colon: Secondary | ICD-10-CM | POA: Diagnosis not present

## 2023-01-20 MED ORDER — SODIUM CHLORIDE 0.9 % IV SOLN
500.0000 mL | Freq: Once | INTRAVENOUS | Status: DC
Start: 2023-01-20 — End: 2023-01-20

## 2023-01-20 NOTE — Patient Instructions (Addendum)
Resume previous diet Continue present medications Await pathology results  Handouts/information given for polyps, diverticulosis  YOU HAD AN ENDOSCOPIC PROCEDURE TODAY AT THE [facility]:   Refer to the procedure report that was given to you for any specific questions about what was found during the examination.  If the procedure report does not answer your questions, please call your gastroenterologist to clarify.  If you requested that your care partner not be given the details of your procedure findings, then the procedure report has been included in a sealed envelope for you to review at your convenience later.  YOU SHOULD EXPECT: Some feelings of bloating in the abdomen. Passage of more gas than usual.  Walking can help get rid of the air that was put into your GI tract during the procedure and reduce the bloating. If you had a lower endoscopy (such as a colonoscopy or flexible sigmoidoscopy) you may notice spotting of blood in your stool or on the toilet paper. If you underwent a bowel prep for your procedure, you may not have a normal bowel movement for a few days.  Please Note:  You might notice some irritation and congestion in your nose or some drainage.  This is from the oxygen used during your procedure.  There is no need for concern and it should clear up in a day or so.  SYMPTOMS TO REPORT IMMEDIATELY:  Following lower endoscopy (colonoscopy):  Excessive amounts of blood in the stool  Significant tenderness or worsening of abdominal pains  Swelling of the abdomen that is new, acute  Fever of 100F or higher  For urgent or emergent issues, a gastroenterologist can be reached at any hour by calling [phone]. Do not use MyChart messaging for urgent concerns.    DIET:  We do recommend a small meal at first, but then you may proceed to your regular diet.  Drink plenty of fluids but you should avoid alcoholic beverages for 24 hours.  ACTIVITY:  You should plan to  take it easy for the rest of today and you should NOT DRIVE or use heavy machinery until tomorrow (because of the sedation medicines used during the test).    FOLLOW UP: Our staff will call the number listed on your records the next business day following your procedure.  We will call around 7:15- 8:00 am to check on you and address any questions or concerns that you may have regarding the information given to you following your procedure. If we do not reach you, we will leave a message.     If any biopsies were taken you will be contacted by phone or by letter within the next 1-3 weeks.  Please call us at [phone] if you have not heard about the biopsies in 3 weeks.    SIGNATURES/CONFIDENTIALITY: You and/or your care partner have signed paperwork which will be entered into your electronic medical record.  These signatures attest to the fact that that the information above on your After Visit Summary has been reviewed and is understood.  Full responsibility of the confidentiality of this discharge information lies with you and/or your care-partner. 

## 2023-01-20 NOTE — Progress Notes (Signed)
Uneventful anesthetic. Report to pacu rn. Vss. Care resumed by rn. 

## 2023-01-20 NOTE — Progress Notes (Signed)
GASTROENTEROLOGY PROCEDURE H&P NOTE   Primary Care Physician: Kristian Covey, MD    Reason for Procedure:  Colon Cancer screening  Plan:    Colonoscopy  Patient is appropriate for endoscopic procedure(s) in the ambulatory ([facility]) setting.  The nature of the procedure, as well as the risks, benefits, and alternatives were carefully and thoroughly reviewed with the patient. Ample time for discussion and questions allowed. The patient understood, was satisfied, and agreed to proceed.     HPI: Dylan Hamilton is a 62 y.o. male who presents for colonoscopy for routine, ongoing Colon Cancer screening.  No active GI symptoms.  No known family history of colon cancer or related malignancy.  Patient is otherwise without complaints or active issues today.  Last colonoscopy was 09/2012 and normal.  Returns today for ongoing CRC screening.  Past Medical History:  Diagnosis Date   Diabetes mellitus type II    Hypercholesterolemia    Hyperlipidemia     Past Surgical History:  Procedure Laterality Date   I & D EXTREMITY  07/17/2012   Procedure: IRRIGATION AND DEBRIDEMENT EXTREMITY;  Surgeon: Johnette Abraham, MD;  Location: [facility] OR;  Service: Plastics;  Laterality: Right;  Repair Right thumb laceration   INGUINAL HERNIA REPAIR  1991   WISDOM TOOTH EXTRACTION      Prior to Admission medications   Medication Sig Start Date End Date Taking? Authorizing Provider  BAYER MICROLET LANCETS lancets Check blood sugars 1-2 times per day. DX: E11.9 10/18/16   Burchette, Elberta Fortis, MD  Blood Glucose Monitoring Suppl (BAYER CONTOUR NEXT MONITOR) w/Device KIT USE AS DIRECTED. 10/26/16   Burchette, Elberta Fortis, MD  EFUDEX 5 % cream SMARTSIG:1 Sparingly Topical Twice Daily Patient not taking: Reported on 12/14/2022 08/27/19   [provider]  glimepiride (AMARYL) 4 MG tablet Take 1 tablet (4 mg total) by mouth daily with breakfast. 02/24/22   Burchette, Elberta Fortis, MD  glucose blood (BAYER CONTOUR TEST) test  strip Check blood sugars 1-2 times per day. DX: E11.9 05/18/22   Burchette, Elberta Fortis, MD  glucose blood (CONTOUR TEST) test strip Use as instructed 02/03/21   Burchette, Elberta Fortis, MD  metFORMIN (GLUCOPHAGE) 500 MG tablet TAKE TWO TABLETS BY MOUTH EVERY MORNING AND TWO TABLETS EVERY EVENING. 02/24/22   Burchette, Elberta Fortis, MD  Microlet Lancets MISC Use as directed twice daily 05/18/22   Burchette, Elberta Fortis, MD  mometasone (ELOCON) 0.1 % lotion Apply topically daily. Patient not taking: Reported on 12/14/2022 03/01/17   Kristian Covey, MD  pioglitazone (ACTOS) 30 MG tablet Take 1 tablet (30 mg total) by mouth daily. 02/24/22   Burchette, Elberta Fortis, MD  rosuvastatin (CRESTOR) 20 MG tablet Take 1 tablet (20 mg total) by mouth once daily. 11/09/22   Burchette, Elberta Fortis, MD    Current Outpatient Medications  Medication Sig Dispense Refill   BAYER MICROLET LANCETS lancets Check blood sugars 1-2 times per day. DX: E11.9 180 each 3   Blood Glucose Monitoring Suppl (BAYER CONTOUR NEXT MONITOR) w/Device KIT USE AS DIRECTED. 1 kit 0   EFUDEX 5 % cream SMARTSIG:1 Sparingly Topical Twice Daily (Patient not taking: Reported on 12/14/2022)     glimepiride (AMARYL) 4 MG tablet Take 1 tablet (4 mg total) by mouth daily with breakfast. 90 tablet 3   glucose blood (BAYER CONTOUR TEST) test strip Check blood sugars 1-2 times per day. DX: E11.9 180 each 3   glucose blood (CONTOUR TEST) test strip Use as instructed 100  each 12   metFORMIN (GLUCOPHAGE) 500 MG tablet TAKE TWO TABLETS BY MOUTH EVERY MORNING AND TWO TABLETS EVERY EVENING. 360 tablet 3   Microlet Lancets MISC Use as directed twice daily 60 each 6   mometasone (ELOCON) 0.1 % lotion Apply topically daily. (Patient not taking: Reported on 12/14/2022) 60 mL 1   pioglitazone (ACTOS) 30 MG tablet Take 1 tablet (30 mg total) by mouth daily. 90 tablet 3   rosuvastatin (CRESTOR) 20 MG tablet Take 1 tablet (20 mg total) by mouth once daily. 90 tablet 0   No current  facility-administered medications for this visit.    Allergies as of 01/20/2023   (No Known Allergies)    Family History  Problem Relation Age of Onset   Diabetes Mother    Esophageal cancer Father    Cancer Father        lung   Diabetes Brother    Cancer Brother        melanoma   Stroke Paternal Grandfather    Cancer Other    Diabetes Other    Colon cancer Neg Hx    Rectal cancer Neg Hx    Stomach cancer Neg Hx    Colon polyps Neg Hx     Social History   Socioeconomic History   Marital status: Married    Spouse name: Not on file   Number of children: Not on file   Years of education: Not on file   Highest education level: Not on file  Occupational History   Not on file  Tobacco Use   Smoking status: Never   Smokeless tobacco: Never  Vaping Use   Vaping Use: Never used  Substance and Sexual Activity   Alcohol use: No    Comment: occ socially   Drug use: No   Sexual activity: Not on file  Other Topics Concern   Not on file  Social History Narrative   Not on file   Social Determinants of Health   Financial Resource Strain: Not on file  Food Insecurity: Not on file  Transportation Needs: Not on file  Physical Activity: Not on file  Stress: Not on file  Social Connections: Not on file  Intimate Partner Violence: Not on file    Physical Exam: Vital signs in last 24 hours: @There  were no vitals taken for this visit. GEN: NAD EYE: Sclerae anicteric ENT: MMM CV: Non-tachycardic Pulm: CTA b/l GI: Soft, NT/ND NEURO:  Alert & Oriented x 3   Doristine Locks, DO [facility]   01/20/2023 7:52 AM

## 2023-01-20 NOTE — Progress Notes (Signed)
Called to room to assist during endoscopic procedure.  Patient ID and intended procedure confirmed with present staff. Received instructions for my participation in the procedure from the performing physician.  

## 2023-01-20 NOTE — Progress Notes (Signed)
Vitals-CW  Pt's states no medical or surgical changes since previsit or office visit. 

## 2023-01-20 NOTE — Op Note (Signed)
[facility] Patient Name: Dylan Hamilton Procedure Date: 01/20/2023 8:18 AM MRN: [id number] Endoscopist: Doristine Locks , MD, [id number] Age: 62 Referring MD:  Date of Birth: 30-Mar-1961 Gender: Male Account #: [id number] Procedure:                Colonoscopy Indications:              Screening for colorectal malignant neoplasm (last                            colonoscopy was 10 years ago)                           Last colonoscopy was 09/2012 and normal. Returns                            today for ongoing CRC screening. Medicines:                Monitored Anesthesia Care Procedure:                Pre-Anesthesia Assessment:                           - Prior to the procedure, a History and Physical                            was performed, and patient medications and                            allergies were reviewed. The patient's tolerance of                            previous anesthesia was also reviewed. The risks                            and benefits of the procedure and the sedation                            options and risks were discussed with the patient.                            All questions were answered, and informed consent                            was obtained. Prior Anticoagulants: The patient has                            taken no anticoagulant or antiplatelet agents. ASA                            Grade Assessment: II - A patient with mild systemic                            disease. After reviewing the risks and benefits,  the patient was deemed in satisfactory condition to                            undergo the procedure.                           After obtaining informed consent, the colonoscope                            was passed under direct vision. Throughout the                            procedure, the patient's blood pressure, pulse, and                            oxygen saturations were monitored continuously. The                             CF [id number] #[id number] was introduced through the anus                            and advanced to the the cecum, identified by                            appendiceal orifice and ileocecal valve. The                            colonoscopy was performed without difficulty. The                            patient tolerated the procedure well. The quality                            of the bowel preparation was good. The ileocecal                            valve, appendiceal orifice, and rectum were                            photographed. Scope In: 8:26:29 AM Scope Out: 8:48:33 AM Scope Withdrawal Time: 0 hours 11 minutes 40 seconds  Total Procedure Duration: 0 hours 22 minutes 4 seconds  Findings:                 The perianal and digital rectal examinations were                            normal.                           Two sessile polyps were found in the distal sigmoid                            colon. The polyps were 2 to 3 mm in size. These  polyps were removed with a cold snare. Resection                            and retrieval were complete. Estimated blood loss                            was minimal.                           A few small-mouthed diverticula were found in the                            sigmoid colon.                           The retroflexed view of the distal rectum and anal                            verge was normal and showed no anal or rectal                            abnormalities.                           The ascending colon revealed moderately excessive                            looping. Advancing the scope required using manual                            pressure. Complications:            No immediate complications. Estimated Blood Loss:     Estimated blood loss was minimal. Impression:               - Two 2 to 3 mm polyps in the distal sigmoid colon,                            removed with a cold  snare. Resected and retrieved.                           - Diverticulosis in the sigmoid colon.                           - The distal rectum and anal verge are normal on                            retroflexion view.                           - There was significant looping of the colon.                           - The GI Genius (intelligent endoscopy module),  computer-aided polyp detection system powered by AI                            was utilized to detect colorectal polyps through                            enhanced visualization during colonoscopy. Recommendation:           - Patient has a contact number available for                            emergencies. The signs and symptoms of potential                            delayed complications were discussed with the                            patient. Return to normal activities tomorrow.                            Written discharge instructions were provided to the                            patient.                           - Resume previous diet.                           - Continue present medications.                           - Await pathology results.                           - Repeat colonoscopy in 5-10 years for surveillance                            based on pathology results.                           - Return to GI office PRN. Doristine Locks, MD 01/20/2023 8:53:31 AM

## 2023-01-21 ENCOUNTER — Telehealth: Payer: Self-pay

## 2023-01-21 NOTE — Telephone Encounter (Signed)
  Follow up Call-     01/20/2023    7:59 AM 01/20/2023    7:54 AM  Call back number  Post procedure Call Back phone  # [phone]   Permission to leave phone message  Yes     Patient questions:  Do you have a fever, pain , or abdominal swelling? No. Pain Score  0 *  Have you tolerated food without any problems? Yes.    Have you been able to return to your normal activities? Yes.    Do you have any questions about your discharge instructions: Diet   No. Medications  No. Follow up visit  No.  Do you have questions or concerns about your Care? No.  Actions: * If pain score is 4 or above: No action needed, pain <4.

## 2023-01-26 DIAGNOSIS — Z08 Encounter for follow-up examination after completed treatment for malignant neoplasm: Secondary | ICD-10-CM | POA: Diagnosis not present

## 2023-01-26 DIAGNOSIS — L814 Other melanin hyperpigmentation: Secondary | ICD-10-CM | POA: Diagnosis not present

## 2023-01-26 DIAGNOSIS — D225 Melanocytic nevi of trunk: Secondary | ICD-10-CM | POA: Diagnosis not present

## 2023-01-26 DIAGNOSIS — L821 Other seborrheic keratosis: Secondary | ICD-10-CM | POA: Diagnosis not present

## 2023-01-26 DIAGNOSIS — L570 Actinic keratosis: Secondary | ICD-10-CM | POA: Diagnosis not present

## 2023-02-09 ENCOUNTER — Other Ambulatory Visit: Payer: BC Managed Care – PPO

## 2023-03-18 ENCOUNTER — Other Ambulatory Visit: Payer: Self-pay | Admitting: Family Medicine

## 2023-07-13 ENCOUNTER — Other Ambulatory Visit: Payer: Self-pay | Admitting: Family Medicine

## 2023-08-01 DIAGNOSIS — L814 Other melanin hyperpigmentation: Secondary | ICD-10-CM | POA: Diagnosis not present

## 2023-08-01 DIAGNOSIS — D225 Melanocytic nevi of trunk: Secondary | ICD-10-CM | POA: Diagnosis not present

## 2023-08-01 DIAGNOSIS — L570 Actinic keratosis: Secondary | ICD-10-CM | POA: Diagnosis not present

## 2023-08-01 DIAGNOSIS — Z08 Encounter for follow-up examination after completed treatment for malignant neoplasm: Secondary | ICD-10-CM | POA: Diagnosis not present

## 2023-08-01 DIAGNOSIS — L821 Other seborrheic keratosis: Secondary | ICD-10-CM | POA: Diagnosis not present

## 2023-08-19 ENCOUNTER — Other Ambulatory Visit: Payer: Self-pay | Admitting: Family Medicine

## 2023-08-22 ENCOUNTER — Telehealth: Payer: Self-pay

## 2023-08-22 NOTE — Telephone Encounter (Signed)
Patient informed that he needs to schedule visit to receive 90 day supply. Rx sent for 30 days until patient is scheduled

## 2023-08-22 NOTE — Telephone Encounter (Signed)
Copied from CRM [id number]. Topic: Clinical - Medication Question >> Aug 19, 2023 10:52 AM Larwance Sachs wrote: Reason for CRM: Patient would like to know if metFORMIN (GLUCOPHAGE) 500 MG tablet supply can be increased from 30 days supply to a 90 day supply, if so the pharmacy is requesting a prescription for 90 day supply to fill. Patient asked Dr. Caryl Never to call back regarding this.

## 2023-10-17 ENCOUNTER — Other Ambulatory Visit: Payer: Self-pay | Admitting: Family Medicine

## 2023-10-21 DIAGNOSIS — H3581 Retinal edema: Secondary | ICD-10-CM | POA: Diagnosis not present

## 2023-10-21 DIAGNOSIS — E113291 Type 2 diabetes mellitus with mild nonproliferative diabetic retinopathy without macular edema, right eye: Secondary | ICD-10-CM | POA: Diagnosis not present

## 2023-10-21 DIAGNOSIS — H40013 Open angle with borderline findings, low risk, bilateral: Secondary | ICD-10-CM | POA: Diagnosis not present

## 2023-10-21 DIAGNOSIS — H25813 Combined forms of age-related cataract, bilateral: Secondary | ICD-10-CM | POA: Diagnosis not present

## 2023-10-21 LAB — HM DIABETES EYE EXAM

## 2023-10-28 ENCOUNTER — Other Ambulatory Visit: Payer: Self-pay | Admitting: Family Medicine

## 2023-12-05 ENCOUNTER — Encounter: Payer: BC Managed Care – PPO | Admitting: Family Medicine

## 2023-12-13 ENCOUNTER — Encounter: Payer: Self-pay | Admitting: Family Medicine

## 2023-12-13 ENCOUNTER — Ambulatory Visit (INDEPENDENT_AMBULATORY_CARE_PROVIDER_SITE_OTHER): Admitting: Family Medicine

## 2023-12-13 VITALS — BP 120/80 | HR 67 | Temp 98.7°F | Ht 73.0 in | Wt 211.0 lb

## 2023-12-13 DIAGNOSIS — Z7984 Long term (current) use of oral hypoglycemic drugs: Secondary | ICD-10-CM | POA: Diagnosis not present

## 2023-12-13 DIAGNOSIS — Z0000 Encounter for general adult medical examination without abnormal findings: Secondary | ICD-10-CM

## 2023-12-13 DIAGNOSIS — E119 Type 2 diabetes mellitus without complications: Secondary | ICD-10-CM

## 2023-12-13 LAB — CBC WITH DIFFERENTIAL/PLATELET
Basophils Absolute: 0.1 10*3/uL (ref 0.0–0.1)
Basophils Relative: 0.7 % (ref 0.0–3.0)
Eosinophils Absolute: 0.7 10*3/uL (ref 0.0–0.7)
Eosinophils Relative: 8.3 % — ABNORMAL HIGH (ref 0.0–5.0)
HCT: 42.4 % (ref 39.0–52.0)
Hemoglobin: 14.4 g/dL (ref 13.0–17.0)
Lymphocytes Relative: 26 % (ref 12.0–46.0)
Lymphs Abs: 2.2 10*3/uL (ref 0.7–4.0)
MCHC: 34.1 g/dL (ref 30.0–36.0)
MCV: 92 fl (ref 78.0–100.0)
Monocytes Absolute: 0.7 10*3/uL (ref 0.1–1.0)
Monocytes Relative: 8.3 % (ref 3.0–12.0)
Neutro Abs: 4.7 10*3/uL (ref 1.4–7.7)
Neutrophils Relative %: 56.7 % (ref 43.0–77.0)
Platelets: 316 10*3/uL (ref 150.0–400.0)
RBC: 4.61 Mil/uL (ref 4.22–5.81)
RDW: 13 % (ref 11.5–15.5)
WBC: 8.3 10*3/uL (ref 4.0–10.5)

## 2023-12-13 LAB — COMPREHENSIVE METABOLIC PANEL WITH GFR
ALT: 16 U/L (ref 0–53)
AST: 14 U/L (ref 0–37)
Albumin: 4.5 g/dL (ref 3.5–5.2)
Alkaline Phosphatase: 56 U/L (ref 39–117)
BUN: 16 mg/dL (ref 6–23)
CO2: 29 meq/L (ref 19–32)
Calcium: 9.5 mg/dL (ref 8.4–10.5)
Chloride: 100 meq/L (ref 96–112)
Creatinine, Ser: 0.8 mg/dL (ref 0.40–1.50)
GFR: 93.46 mL/min (ref >60.00)
Glucose, Bld: 162 mg/dL — ABNORMAL HIGH (ref 70–99)
Potassium: 4.7 meq/L (ref 3.5–5.1)
Sodium: 136 meq/L (ref 135–145)
Total Bilirubin: 0.6 mg/dL (ref 0.2–1.2)
Total Protein: 7.4 g/dL (ref 6.0–8.3)

## 2023-12-13 LAB — MICROALBUMIN / CREATININE URINE RATIO
Creatinine,U: 74.5 mg/dL
Microalb Creat Ratio: UNDETERMINED mg/g (ref 0.0–30.0)
Microalb, Ur: <0.7 mg/dL

## 2023-12-13 LAB — LIPID PANEL
Cholesterol: 211 mg/dL — ABNORMAL HIGH (ref 0–200)
HDL: 38.8 mg/dL — ABNORMAL LOW (ref >39.00)
LDL Cholesterol: 143 mg/dL — ABNORMAL HIGH (ref 0–99)
NonHDL: 172.69
Total CHOL/HDL Ratio: 5
Triglycerides: 148 mg/dL (ref 0.0–149.0)
VLDL: 29.6 mg/dL (ref 0.0–40.0)

## 2023-12-13 LAB — HEMOGLOBIN A1C: Hgb A1c MFr Bld: 7.8 % — ABNORMAL HIGH (ref 4.6–6.5)

## 2023-12-13 LAB — PSA: PSA: 1.97 ng/mL (ref 0.10–4.00)

## 2023-12-13 MED ORDER — METFORMIN HCL 500 MG PO TABS: ORAL_TABLET | ORAL | 3 refills | Status: AC

## 2023-12-13 NOTE — Progress Notes (Signed)
 Established Patient Office Visit  Subjective   Patient ID: Dylan Hamilton, male    DOB: [birth year]  Age: 63 y.o. MRN: [id number]  Chief Complaint  Patient presents with   Annual Exam    HPI   [name] is here for physical exam.  Not seen in the past year.  He has history of type 2 diabetes, skin cancer, dyslipidemia.  Basically has not been taking any of his prescribed medications other than metformin .  He had concerns that Crestor  may cause memory loss and dementia.  He also has not been taking glimepiride  or Actos .  He does state he has lost some weight intentionally due to dietary changes.  Not monitoring blood sugars regularly.  Overdue for labs including urine microalbumin and A1c.  Health maintenance reviewed:  Health Maintenance  Topic Date Due   HIV Screening  Never done   HEMOGLOBIN A1C  05/11/2023   Diabetic kidney evaluation - eGFR measurement  11/08/2023   Diabetic kidney evaluation - Urine ACR  11/08/2023   COVID-19 Vaccine (1) 12/29/2023 (Originally 05/02/1966)   Zoster Vaccines- Shingrix (1 of 2) 03/13/2024 (Originally 05/02/1978)   DTaP/Tdap/Td (3 - Td or Tdap) 12/12/2024 (Originally 01/28/2022)   Pneumococcal Vaccine 79-48 Years old (1 of 2 - PCV) 12/12/2024 (Originally 05/02/1978)   OPHTHALMOLOGY EXAM  10/20/2024   FOOT EXAM  12/12/2024   Colonoscopy  01/19/2033   Hepatitis C Screening  Completed   HPV VACCINES  Aged Out   Meningococcal B Vaccine  Aged Out   INFLUENZA VACCINE  Discontinued   - Declines vaccines including pneumonia and Shingrix.  Also declines tetanus booster.  Family history reviewed.  Mom is 1 with dementia and type 2 diabetes.  Brother with history of melanoma and another brother with type 2 diabetes.  Daughter with ocular melanoma  Social history continues to work retirement Scientist, research (medical).  6 grandchildren with another on the way.  Non-smoker.  No alcohol.  Past Medical History:  Diagnosis Date   Cancer [facility]) March 2024   Treated  by dermatologist   Diabetes mellitus type II    Hypercholesterolemia    Hyperlipidemia    Past Surgical History:  Procedure Laterality Date   I & D EXTREMITY  07/17/2012   Procedure: IRRIGATION AND DEBRIDEMENT EXTREMITY;  Surgeon: Ronnell Coins, MD;  Location: [facility] OR;  Service: Plastics;  Laterality: Right;  Repair Right thumb laceration   INGUINAL HERNIA REPAIR  1991   WISDOM TOOTH EXTRACTION      reports that he has never smoked. He has never used smokeless tobacco. He reports that he does not drink alcohol and does not use drugs. family history includes Cancer in his brother, father, and another family member; Diabetes in his brother, mother, and another family member; Esophageal cancer in his father; Stroke in his paternal grandfather. No Known Allergies  Review of Systems  Constitutional:  Negative for chills, fever, malaise/fatigue and weight loss.  HENT:  Negative for hearing loss.   Eyes:  Negative for blurred vision and double vision.  Respiratory:  Negative for cough and shortness of breath.   Cardiovascular:  Negative for chest pain, palpitations and leg swelling.  Gastrointestinal:  Negative for abdominal pain, blood in stool, constipation and diarrhea.  Genitourinary:  Negative for dysuria.  Skin:  Negative for rash.  Neurological:  Negative for dizziness, speech change, seizures, loss of consciousness and headaches.  Psychiatric/Behavioral:  Negative for depression.       Objective:  BP 120/80 (BP Location: Left Arm, Patient Position: Sitting, Cuff Size: Normal)   Pulse 67   Temp 98.7 F (37.1 C) (Oral)   Ht 6\' 1"  (1.854 m)   Wt 211 lb (95.7 kg)   SpO2 98%   BMI 27.84 kg/m  BP Readings from Last 3 Encounters:  12/13/23 120/80  01/20/23 (!) 147/82  11/09/22 (!) 154/78   Wt Readings from Last 3 Encounters:  12/13/23 211 lb (95.7 kg)  01/20/23 230 lb (104.3 kg)  12/14/22 230 lb (104.3 kg)      Physical Exam Vitals reviewed.  Constitutional:       General: He is not in acute distress.    Appearance: He is well-developed.  HENT:     Head: Normocephalic and atraumatic.     Right Ear: External ear normal.     Left Ear: External ear normal.  Eyes:     Conjunctiva/sclera: Conjunctivae normal.     Pupils: Pupils are equal, round, and reactive to light.  Neck:     Thyroid : No thyromegaly.  Cardiovascular:     Rate and Rhythm: Normal rate and regular rhythm.     Heart sounds: Normal heart sounds. No murmur heard. Pulmonary:     Effort: No respiratory distress.     Breath sounds: No wheezing or rales.  Abdominal:     General: Bowel sounds are normal. There is no distension.     Palpations: Abdomen is soft. There is no mass.     Tenderness: There is no abdominal tenderness. There is no guarding or rebound.  Musculoskeletal:     Cervical back: Normal range of motion and neck supple.     Right lower leg: No edema.     Left lower leg: No edema.  Lymphadenopathy:     Cervical: No cervical adenopathy.  Skin:    Findings: No rash.     Comments: Feet reveal no skin lesions. Good distal foot pulses. Good capillary refill. No calluses. Normal sensation with monofilament testing   Neurological:     Mental Status: He is alert and oriented to person, place, and time.     Cranial Nerves: No cranial nerve deficit.     Deep Tendon Reflexes: Reflexes normal.      No results found for any visits on 12/13/23.    The 10-year ASCVD risk score (Arnett DK, et al., 2019) is: 21.4%    Assessment & Plan:   Problem List Items Addressed This Visit       Unprioritized   Type 2 diabetes mellitus, controlled (HCC)   Relevant Medications   metFORMIN  (GLUCOPHAGE ) 500 MG tablet   Other Relevant Orders   Hemoglobin A1c   Microalbumin / creatinine urine ratio   Other Visit Diagnoses       Physical exam    -  Primary   Relevant Orders   CBC with Differential/Platelet   Comprehensive metabolic panel with GFR   Lipid panel   PSA      Patient here for physical.  He has lost significant amount of weight since last year and hopefully this reflects his positive efforts versus poor diabetes control  -Poor compliance with diabetes medications.  He also had concerns regarding risk of dementia with statins.  On the contrary, we discussed a couple of studies that showed approximately 35% reduction in dementia in patients on statin  -Obtain follow-up labs as above.  Readdress diabetes medications then depending on A1c results  -Offered tetanus and pneumonia vaccines and he  declines  No follow-ups on file.    Glean Lamy, MD

## 2023-12-22 DIAGNOSIS — R6889 Other general symptoms and signs: Secondary | ICD-10-CM | POA: Diagnosis not present

## 2024-01-05 ENCOUNTER — Other Ambulatory Visit: Payer: Self-pay | Admitting: Family Medicine

## 2024-02-23 DIAGNOSIS — L821 Other seborrheic keratosis: Secondary | ICD-10-CM | POA: Diagnosis not present

## 2024-02-23 DIAGNOSIS — Z08 Encounter for follow-up examination after completed treatment for malignant neoplasm: Secondary | ICD-10-CM | POA: Diagnosis not present

## 2024-02-23 DIAGNOSIS — D225 Melanocytic nevi of trunk: Secondary | ICD-10-CM | POA: Diagnosis not present

## 2024-02-23 DIAGNOSIS — L814 Other melanin hyperpigmentation: Secondary | ICD-10-CM | POA: Diagnosis not present

## 2024-02-23 NOTE — Progress Notes (Signed)
 [facility] Quality Team Note  Name: Dylan Hamilton Date of Birth: 06/27/1961 MRN: [id number] Date: 02/23/2024  [facility] Quality Team has reviewed this patient's chart, please see recommendations below:  [facility] Quality Other; (Chart reviewed. Abstracted kidney health evaluation labs)

## 2024-03-12 ENCOUNTER — Ambulatory Visit: Admitting: Family Medicine

## 2024-04-20 DIAGNOSIS — H3581 Retinal edema: Secondary | ICD-10-CM | POA: Diagnosis not present

## 2024-04-20 DIAGNOSIS — E113291 Type 2 diabetes mellitus with mild nonproliferative diabetic retinopathy without macular edema, right eye: Secondary | ICD-10-CM | POA: Diagnosis not present
